# Patient Record
Sex: Female | Born: 1995 | Race: Black or African American | Hispanic: No | Smoking: Never smoker
Health system: Southern US, Community
[De-identification: ages and names within clinical notes are randomized; demographics above are authoritative.]

## PROBLEM LIST (undated history)

## (undated) DIAGNOSIS — G43909 Migraine, unspecified, not intractable, without status migrainosus: Secondary | ICD-10-CM

## (undated) DIAGNOSIS — Z789 Other specified health status: Secondary | ICD-10-CM

## (undated) HISTORY — PX: WISDOM TOOTH EXTRACTION: SHX21

## (undated) HISTORY — DX: Migraine, unspecified, not intractable, without status migrainosus: G43.909

---
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
## 2014-11-23 ENCOUNTER — Encounter (HOSPITAL_COMMUNITY): Payer: Self-pay | Admitting: *Deleted

## 2014-11-23 ENCOUNTER — Emergency Department (HOSPITAL_COMMUNITY)
Admission: EM | Admit: 2014-11-23 | Discharge: 2014-11-23 | Disposition: A | Discharge status: Home / Self Care | Payer: PRIVATE HEALTH INSURANCE | Attending: Emergency Medicine | Admitting: Emergency Medicine

## 2014-11-23 DIAGNOSIS — N3000 Acute cystitis without hematuria: Secondary | ICD-10-CM | POA: Diagnosis not present

## 2014-11-23 DIAGNOSIS — R109 Unspecified abdominal pain: Secondary | ICD-10-CM | POA: Diagnosis present

## 2014-11-23 DIAGNOSIS — Z3202 Encounter for pregnancy test, result negative: Secondary | ICD-10-CM | POA: Insufficient documentation

## 2014-11-23 DIAGNOSIS — Z79899 Other long term (current) drug therapy: Secondary | ICD-10-CM | POA: Diagnosis not present

## 2014-11-23 LAB — URINALYSIS, ROUTINE W REFLEX MICROSCOPIC
Bilirubin Urine: NEGATIVE
Glucose, UA: NEGATIVE mg/dL
KETONES UR: NEGATIVE mg/dL
Nitrite: NEGATIVE
Protein, ur: NEGATIVE mg/dL
SPECIFIC GRAVITY, URINE: 1.016 (ref 1.005–1.030)
Urobilinogen, UA: 0.2 mg/dL (ref 0.0–1.0)
pH: 7 (ref 5.0–8.0)

## 2014-11-23 LAB — URINE MICROSCOPIC-ADD ON

## 2014-11-23 LAB — PREGNANCY, URINE: PREG TEST UR: NEGATIVE

## 2014-11-23 MED ORDER — CEPHALEXIN 500 MG PO CAPS: 500.0000 mg | ORAL_CAPSULE | Freq: Three times a day (TID) | ORAL | Status: DC

## 2014-11-23 MED ORDER — ONDANSETRON 8 MG PO TBDP: 8.0000 mg | ORAL_TABLET | Freq: Three times a day (TID) | ORAL | Status: DC | PRN

## 2014-11-23 MED ORDER — CEPHALEXIN 250 MG PO CAPS
500.0000 mg | ORAL_CAPSULE | Freq: Once | ORAL | Status: AC
  Administered 2014-11-23: 500 mg via ORAL
  Filled 2014-11-23: qty 2

## 2014-11-23 NOTE — ED Provider Notes (Signed)
CSN: [id number]     Arrival date & time 11/23/14  [id number] History   First MD Initiated Contact with Patient 11/23/14 [phone]     Chief Complaint  Patient presents with  . Flank Pain      HPI Patient presents to the emergency department complaining of suprapubic discomfort and bilateral flank pain with mild nausea.  She denies vomiting.  She denies diarrhea.  No fevers or chills.  She's had pyelonephritis before and is concerned that this could be the presenting symptoms again.  She has urinary frequency.  Symptoms are mild to moderate in severity.  Nothing worsens or improves her symptoms   History reviewed. No pertinent past medical history. History reviewed. No pertinent past surgical history. No family history on file. History  Substance Use Topics  . Smoking status: Never Smoker   . Smokeless tobacco: Not on file  . Alcohol Use: No   OB History    No data available     Review of Systems  All other systems reviewed and are negative.     Allergies  Review of patient's allergies indicates no known allergies.  Home Medications   Prior to Admission medications   Medication Sig Start Date End Date Taking? Authorizing Provider  ferrous sulfate 325 (65 FE) MG tablet Take 325 mg by mouth daily with breakfast.   Yes Historical Provider, MD  Levonorgestrel (SKYLA IU) 1 Device by Intrauterine route continuous.   Yes Historical Provider, MD  cephALEXin (KEFLEX) 500 MG capsule Take 1 capsule (500 mg total) by mouth 3 (three) times daily. 11/23/14   Lyanne CoKevin M Challis Crill, MD   BP 107/63 mmHg  Pulse 74  Temp(Src) 97.8 F (36.6 C) (Oral)  Resp 17  Ht 5\' 8"  (1.727 m)  Wt 167 lb (75.751 kg)  BMI 25.40 kg/m2  SpO2 100%  LMP 11/16/2014 Physical Exam  Constitutional: She is oriented to person, place, and time. She appears well-developed and well-nourished. No distress.  HENT:  Head: Normocephalic and atraumatic.  Eyes: EOM are normal.  Neck: Normal range of motion.  Cardiovascular: Normal  rate, regular rhythm and normal heart sounds.   Pulmonary/Chest: Effort normal and breath sounds normal.  Abdominal: Soft. She exhibits no distension. There is no tenderness.  Genitourinary:  No CVA tenderness  Musculoskeletal: Normal range of motion.  Neurological: She is alert and oriented to person, place, and time.  Skin: Skin is warm and dry.  Psychiatric: She has a normal mood and affect. Judgment normal.  Nursing note and vitals reviewed.   ED Course  Procedures (including critical care time) Labs Review Labs Reviewed  URINALYSIS, ROUTINE W REFLEX MICROSCOPIC - Abnormal; Notable for the following:    APPearance CLOUDY (*)    Hgb urine dipstick MODERATE (*)    Leukocytes, UA LARGE (*)    All other components within normal limits  URINE MICROSCOPIC-ADD ON - Abnormal; Notable for the following:    Bacteria, UA FEW (*)    All other components within normal limits  PREGNANCY, URINE    Imaging Review No results found.   EKG Interpretation None      MDM   Final diagnoses:  Acute cystitis without hematuria    Patient be treated for urinary tract infection.  Home with anti-nausea medicine and antibiotics.    Lyanne CoKevin M Sladen Plancarte, MD 11/23/14 [phone]

## 2014-11-23 NOTE — ED Notes (Signed)
The pt is c/o bi-lateral flank pain since yesterday with nausea.. She reports that her kidneys are hurting  lmp last week

## 2014-12-22 NOTE — L&D Delivery Note (Signed)
  Vaginal Delivery Note The pt utilized an epidural as pain management.   Spontaneous  rupture of membranes today, at 1530 on 10/15/15, clear.  GBS was negative. Cervical dilation was complete at  1005.  NICHD Category 1.    Pushing with guidance began at 1145.   After  1 hour and  14 minutes of pushing the head, shoulders and the body of a viable female infant "Whitney Martin" delivered spontaneously with maternal effort in the LOA position at 1254   With vigorous tone and spontaneous cry, the infant was placed on moms abd.  After the umbilical cord was clamped it was cut by the FOB, then cord blood was obtained for evaluation.  Spontaneous delivery of a intact placenta with a 3 vessel cord via Shultz/Duncan at 1302 .   Episiotomy: None   The vulva, perineum, vaginal vault, rectum and cervix were inspected revealing a hemostatic1rst degree vaginal, no repairs needed.  Postpartum pitocin as ordered.  Fundus firm, lochia minimum, bleeding under control. EBL 90, Pt hemodynamically stable.   Sponge, laps and needle count correct and verified with the primary care nurse.  Attending MD Present at delivery.   Routine postpartum orders   Mother unsure about method of contraception  Mom plans to breastfeed  Infant to have outpatient circumcision   Placenta to pathology: NO     Cord gases sent to lab: NO  Cord blood sent to lab: YES   APGARS:  9 at 1 minute and 9 at 5 minutes Weight:. TBD   Both mom and baby were left in stable condition, baby skin to skin.    Alphonzo Severanceachel Wateen Varon, CNM, MSN 10/15/2015. 1:50 PM

## 2015-03-03 ENCOUNTER — Emergency Department (HOSPITAL_COMMUNITY): Payer: Medicaid Other

## 2015-03-03 ENCOUNTER — Emergency Department (HOSPITAL_COMMUNITY)
Admission: EM | Admit: 2015-03-03 | Discharge: 2015-03-04 | Disposition: A | Discharge status: Home / Self Care | Payer: Medicaid Other | Attending: Emergency Medicine | Admitting: Emergency Medicine

## 2015-03-03 ENCOUNTER — Encounter (HOSPITAL_COMMUNITY): Payer: Self-pay | Admitting: Emergency Medicine

## 2015-03-03 DIAGNOSIS — R109 Unspecified abdominal pain: Secondary | ICD-10-CM

## 2015-03-03 DIAGNOSIS — Z792 Long term (current) use of antibiotics: Secondary | ICD-10-CM | POA: Insufficient documentation

## 2015-03-03 DIAGNOSIS — Z3A00 Weeks of gestation of pregnancy not specified: Secondary | ICD-10-CM | POA: Diagnosis not present

## 2015-03-03 DIAGNOSIS — R1030 Lower abdominal pain, unspecified: Secondary | ICD-10-CM | POA: Insufficient documentation

## 2015-03-03 DIAGNOSIS — O2341 Unspecified infection of urinary tract in pregnancy, first trimester: Secondary | ICD-10-CM | POA: Diagnosis not present

## 2015-03-03 DIAGNOSIS — O9989 Other specified diseases and conditions complicating pregnancy, childbirth and the puerperium: Secondary | ICD-10-CM | POA: Diagnosis not present

## 2015-03-03 DIAGNOSIS — O26899 Other specified pregnancy related conditions, unspecified trimester: Secondary | ICD-10-CM

## 2015-03-03 DIAGNOSIS — Z79899 Other long term (current) drug therapy: Secondary | ICD-10-CM | POA: Diagnosis not present

## 2015-03-03 DIAGNOSIS — N390 Urinary tract infection, site not specified: Secondary | ICD-10-CM

## 2015-03-03 LAB — COMPREHENSIVE METABOLIC PANEL
ALT: 9 U/L (ref 0–35)
ANION GAP: 6 (ref 5–15)
AST: 16 U/L (ref 0–37)
Albumin: 3.8 g/dL (ref 3.5–5.2)
Alkaline Phosphatase: 51 U/L (ref 39–117)
BUN: 9 mg/dL (ref 6–23)
CHLORIDE: 105 mmol/L (ref 96–112)
CO2: 26 mmol/L (ref 19–32)
CREATININE: 0.7 mg/dL (ref 0.50–1.10)
Calcium: 9.3 mg/dL (ref 8.4–10.5)
GFR calc non Af Amer: >90 mL/min (ref >90)
Glucose, Bld: 94 mg/dL (ref 70–99)
POTASSIUM: 4.1 mmol/L (ref 3.5–5.1)
SODIUM: 137 mmol/L (ref 135–145)
Total Bilirubin: 0.2 mg/dL — ABNORMAL LOW (ref 0.3–1.2)
Total Protein: 6.8 g/dL (ref 6.0–8.3)

## 2015-03-03 LAB — CBC WITH DIFFERENTIAL/PLATELET
BASOS ABS: 0 10*3/uL (ref 0.0–0.1)
Basophils Relative: 0 % (ref 0–1)
EOS ABS: 0.2 10*3/uL (ref 0.0–0.7)
EOS PCT: 2 % (ref 0–5)
HCT: 40 % (ref 36.0–46.0)
HEMOGLOBIN: 13.4 g/dL (ref 12.0–15.0)
LYMPHS PCT: 21 % (ref 12–46)
Lymphs Abs: 2.3 10*3/uL (ref 0.7–4.0)
MCH: 29.1 pg (ref 26.0–34.0)
MCHC: 33.5 g/dL (ref 30.0–36.0)
MCV: 87 fL (ref 78.0–100.0)
Monocytes Absolute: 0.9 10*3/uL (ref 0.1–1.0)
Monocytes Relative: 8 % (ref 3–12)
NEUTROS ABS: 7.7 10*3/uL (ref 1.7–7.7)
NEUTROS PCT: 69 % (ref 43–77)
Platelets: 300 10*3/uL (ref 150–400)
RBC: 4.6 MIL/uL (ref 3.87–5.11)
RDW: 13.1 % (ref 11.5–15.5)
WBC: 11.1 10*3/uL — AB (ref 4.0–10.5)

## 2015-03-03 LAB — URINALYSIS, ROUTINE W REFLEX MICROSCOPIC
Bilirubin Urine: NEGATIVE
Glucose, UA: NEGATIVE mg/dL
KETONES UR: NEGATIVE mg/dL
NITRITE: NEGATIVE
PROTEIN: NEGATIVE mg/dL
SPECIFIC GRAVITY, URINE: 1.01 (ref 1.005–1.030)
Urobilinogen, UA: 0.2 mg/dL (ref 0.0–1.0)
pH: 5.5 (ref 5.0–8.0)

## 2015-03-03 LAB — URINE MICROSCOPIC-ADD ON

## 2015-03-03 LAB — HCG, QUANTITATIVE, PREGNANCY: hCG, Beta Chain, Quant, S: 39067 m[IU]/mL — ABNORMAL HIGH (ref <5)

## 2015-03-03 LAB — POC URINE PREG, ED: Preg Test, Ur: POSITIVE — AB

## 2015-03-03 MED ORDER — ACETAMINOPHEN 500 MG PO TABS
1000.0000 mg | ORAL_TABLET | Freq: Once | ORAL | Status: AC
  Administered 2015-03-03: 1000 mg via ORAL
  Filled 2015-03-03: qty 2

## 2015-03-03 NOTE — ED Notes (Signed)
Pt transported to US

## 2015-03-03 NOTE — ED Notes (Signed)
Lab called to add on HcG

## 2015-03-03 NOTE — ED Notes (Signed)
Pt c/o intermittent abdominal pain x 2 weeks. Pt is currently pregnant but unsure how far along. Pt denies vaginal discharge.

## 2015-03-03 NOTE — ED Provider Notes (Signed)
CSN: [id number]     Arrival date & time 03/03/15  1526 History   First MD Initiated Contact with Patient 03/03/15 2000     Chief Complaint  Patient presents with  . Abdominal Pain     (Consider location/radiation/quality/duration/timing/severity/associated sxs/prior Treatment) HPI Comments: Patient presents to the emergency department with chief complaint of intermittent abdominal pain 2 weeks. She states that she knows that she is pregnant, but is uncertain how far along she has. She denies any vaginal discharge or bleeding. She does report having dysuria. She denies any fevers, chills, nausea, or vomiting. There are no aggravating or alleviating factors.  The history is provided by the patient. No language interpreter was used.    History reviewed. No pertinent past medical history. History reviewed. No pertinent past surgical history. No family history on file. History  Substance Use Topics  . Smoking status: Never Smoker   . Smokeless tobacco: Not on file  . Alcohol Use: No   OB History    Gravida Para Term Preterm AB TAB SAB Ectopic Multiple Living   1              Review of Systems  Constitutional: Negative for fever and chills.  Respiratory: Negative for shortness of breath.   Cardiovascular: Negative for chest pain.  Gastrointestinal: Positive for abdominal pain. Negative for nausea, vomiting, diarrhea and constipation.  Genitourinary: Negative for dysuria, vaginal bleeding, vaginal discharge and vaginal pain.  All other systems reviewed and are negative.     Allergies  Review of patient's allergies indicates no known allergies.  Home Medications   Prior to Admission medications   Medication Sig Start Date End Date Taking? Authorizing Provider  cephALEXin (KEFLEX) 500 MG capsule Take 1 capsule (500 mg total) by mouth 3 (three) times daily. 11/23/14   Azalia Bilis, MD  ferrous sulfate 325 (65 FE) MG tablet Take 325 mg by mouth daily with breakfast.    Historical  Provider, MD  Levonorgestrel (SKYLA IU) 1 Device by Intrauterine route continuous.    Historical Provider, MD  ondansetron (ZOFRAN ODT) 8 MG disintegrating tablet Take 1 tablet (8 mg total) by mouth every 8 (eight) hours as needed for nausea or vomiting. 11/23/14   Azalia Bilis, MD   BP 111/62 mmHg  Pulse 71  Temp(Src) 98.5 F (36.9 C) (Oral)  Resp 12  Ht  (1.702 m)  Wt 154 lb (69.854 kg)  BMI 24.11 kg/m2  SpO2 100%  LMP 01/04/2015 (Approximate) Physical Exam  Constitutional: She is oriented to person, place, and time. She appears well-developed and well-nourished.  HENT:  Head: Normocephalic and atraumatic.  Eyes: Conjunctivae and EOM are normal. Pupils are equal, round, and reactive to light.  Neck: Normal range of motion. Neck supple.  Cardiovascular: Normal rate and regular rhythm.  Exam reveals no gallop and no friction rub.   No murmur heard. Pulmonary/Chest: Effort normal and breath sounds normal. No respiratory distress. She has no wheezes. She has no rales. She exhibits no tenderness.  Abdominal: Soft. Bowel sounds are normal. She exhibits no distension and no mass. There is no tenderness. There is no rebound and no guarding.  Mild suprapubic abdominal tenderness, otherwise no focal abdominal tenderness  Genitourinary:  Pelvic exam chaperoned by female ER tech, no right or left adnexal tenderness, no uterine tenderness, no vaginal discharge, no bleeding, no CMT or friability, no foreign body, no injury to the external genitalia, no other significant findings, Os is closed   Musculoskeletal: Normal range  of motion. She exhibits no edema or tenderness.  Neurological: She is alert and oriented to person, place, and time.  Skin: Skin is warm and dry.  Psychiatric: She has a normal mood and affect. Her behavior is normal. Judgment and thought content normal.  Nursing note and vitals reviewed.   ED Course  Procedures (including critical care time) Labs Review Labs  Reviewed  CBC WITH DIFFERENTIAL/PLATELET - Abnormal; Notable for the following:    WBC 11.1 (*)    All other components within normal limits  COMPREHENSIVE METABOLIC PANEL - Abnormal; Notable for the following:    Total Bilirubin 0.2 (*)    All other components within normal limits  URINALYSIS, ROUTINE W REFLEX MICROSCOPIC - Abnormal; Notable for the following:    APPearance CLOUDY (*)    Hgb urine dipstick LARGE (*)    Leukocytes, UA LARGE (*)    All other components within normal limits  URINE MICROSCOPIC-ADD ON - Abnormal; Notable for the following:    Squamous Epithelial / LPF FEW (*)    Bacteria, UA FEW (*)    All other components within normal limits  POC URINE PREG, ED - Abnormal; Notable for the following:    Preg Test, Ur POSITIVE (*)    All other components within normal limits  WET PREP, GENITAL  HCG, QUANTITATIVE, PREGNANCY  GC/CHLAMYDIA PROBE AMP ([place])    Imaging Review [place] Ob Comp Less 14 Wks  03/03/2015   CLINICAL DATA:  Subacute onset of mid to lower abdominal pain. Initial encounter.  EXAM: OBSTETRIC <14 WK US AND TRANSVAGINAL OB US  TECHNIQUE: Both transabdominal and transvaginal ultrasound examinations were performed for complete evaluation of the gestation as well as the maternal uterus, adnexal regions, and pelvic cul-de-sac. Transvaginal technique was performed to assess early pregnancy.  COMPARISON:  CT of the abdomen and pelvis performed 06/20/2004  FINDINGS: Intrauterine gestational sac: Visualized/normal in shape.  Yolk sac:  Yes  Embryo:  Yes  Cardiac Activity: Yes  Heart Rate: 131  bpm  CRL:  6.0 mm   6 w   3 d                  US EDC: 10/24/2015  Maternal uterus/adnexae: No subchorionic hemorrhage is noted. The uterus is retroflexed and unremarkable in appearance.  The ovaries are grossly unremarkable in appearance. The right ovary measures 3.2 x 2.4 x 2.8 cm. A right-sided corpus luteum is noted. The left ovary is within normal limits, measuring 3.3 x  1.1 x 1.6 cm. No suspicious adnexal masses are seen; there is no evidence for ovarian torsion.  A trace amount of free fluid is noted within the pelvic cul-de-sac.  IMPRESSION: Single live intrauterine pregnancy noted, with a crown-rump length of 6 mm, corresponding to a gestational age of [redacted] weeks 3 days. This does not match the gestational age by LMP, and reflects a new estimated date of delivery of October 24, 2015.   Electronically Signed   By: Roanna RaiderJeffery  Chang M.D.   On: 03/03/2015 20:50   [place] Ob Transvaginal  03/03/2015   CLINICAL DATA:  Subacute onset of mid to lower abdominal pain. Initial encounter.  EXAM: OBSTETRIC <14 WK US AND TRANSVAGINAL OB US  TECHNIQUE: Both transabdominal and transvaginal ultrasound examinations were performed for complete evaluation of the gestation as well as the maternal uterus, adnexal regions, and pelvic cul-de-sac. Transvaginal technique was performed to assess early pregnancy.  COMPARISON:  CT of the abdomen and pelvis performed 06/20/2004  FINDINGS: Intrauterine gestational sac: Visualized/normal in shape.  Yolk sac:  Yes  Embryo:  Yes  Cardiac Activity: Yes  Heart Rate: 131  bpm  CRL:  6.0 mm   6 w   3 d                  [place] EDC: 10/24/2015  Maternal uterus/adnexae: No subchorionic hemorrhage is noted. The uterus is retroflexed and unremarkable in appearance.  The ovaries are grossly unremarkable in appearance. The right ovary measures 3.2 x 2.4 x 2.8 cm. A right-sided corpus luteum is noted. The left ovary is within normal limits, measuring 3.3 x 1.1 x 1.6 cm. No suspicious adnexal masses are seen; there is no evidence for ovarian torsion.  A trace amount of free fluid is noted within the pelvic cul-de-sac.  IMPRESSION: Single live intrauterine pregnancy noted, with a crown-rump length of 6 mm, corresponding to a gestational age of [redacted] weeks 3 days. This does not match the gestational age by LMP, and reflects a new estimated date of delivery of October 24, 2015.    Electronically Signed   By: Roanna Raider M.D.   On: 03/03/2015 20:50     EKG Interpretation None      MDM   Final diagnoses:  Abdominal pain in pregnancy  UTI (lower urinary tract infection)    Patient with intermittent abdominal pain and dysuria 2 weeks. She also has positive pregnancy test. Ultrasound is remarkable for a single intrauterine pregnancy. HCG is pending. Urinalysis remarkable for probable UTI. Will treat with Keflex. Anticipate follow-up with OB/GYN.  HCG is consistent with age pregnancy. Wet prep is pending.  Wet prep negative.  Will treat for UTI.  Reassuring [place] and HCG.  No vaginal bleeding.  DC to home with OBGYN follow-up.  Roxy Horseman, PA-C 03/04/15 0018  Blake Divine, MD 03/04/15 [phone]

## 2015-03-04 LAB — WET PREP, GENITAL
Clue Cells Wet Prep HPF POC: NONE SEEN
TRICH WET PREP: NONE SEEN
Yeast Wet Prep HPF POC: NONE SEEN

## 2015-03-04 MED ORDER — CEPHALEXIN 500 MG PO CAPS: 500.0000 mg | ORAL_CAPSULE | Freq: Four times a day (QID) | ORAL | Status: DC

## 2015-03-04 NOTE — ED Notes (Signed)
Pt. Refused wheelchair and left with all belongings 

## 2015-03-04 NOTE — Discharge Instructions (Signed)
Abdominal Pain During Pregnancy °Abdominal pain is common in pregnancy. Most of the time, it does not cause harm. There are many causes of abdominal pain. Some causes are more serious than others. Some of the causes of abdominal pain in pregnancy are easily diagnosed. Occasionally, the diagnosis takes time to understand. Other times, the cause is not determined. Abdominal pain can be a sign that something is very wrong with the pregnancy, or the pain may have nothing to do with the pregnancy at all. For this reason, always tell your health care provider if you have any abdominal discomfort. °HOME CARE INSTRUCTIONS  °Monitor your abdominal pain for any changes. The following actions may help to alleviate any discomfort you are experiencing: °· Do not have sexual intercourse or put anything in your vagina until your symptoms go away completely. °· Get plenty of rest until your pain improves. °· Drink clear fluids if you feel nauseous. Avoid solid food as long as you are uncomfortable or nauseous. °· Only take over-the-counter or prescription medicine as directed by your health care provider. °· Keep all follow-up appointments with your health care provider. °SEEK IMMEDIATE MEDICAL CARE IF: °· You are bleeding, leaking fluid, or passing tissue from the vagina. °· You have increasing pain or cramping. °· You have persistent vomiting. °· You have painful or bloody urination. °· You have a fever. °· You notice a decrease in your baby's movements. °· You have extreme weakness or feel faint. °· You have shortness of breath, with or without abdominal pain. °· You develop a severe headache with abdominal pain. °· You have abnormal vaginal discharge with abdominal pain. °· You have persistent diarrhea. °· You have abdominal pain that continues even after rest, or gets worse. °MAKE SURE YOU:  °· Understand these instructions. °· Will watch your condition. °· Will get help right away if you are not doing well or get  worse. °Document Released: 12/08/2005 Document Revised: 09/28/2013 Document Reviewed: 07/07/2013 °ExitCare® Patient Information ©2015 [employer]. This information is not intended to replace advice given to you by your health care provider. Make sure you discuss any questions you have with your health care provider. ° °Urinary Tract Infection °Urinary tract infections (UTIs) can develop anywhere along your urinary tract. Your urinary tract is your body's drainage system for removing wastes and extra water. Your urinary tract includes two kidneys, two ureters, a bladder, and a urethra. Your kidneys are a pair of bean-shaped organs. Each kidney is about the size of your fist. They are located below your ribs, one on each side of your spine. °CAUSES °Infections are caused by microbes, which are microscopic organisms, including fungi, viruses, and bacteria. These organisms are so small that they can only be seen through a microscope. Bacteria are the microbes that most commonly cause UTIs. °SYMPTOMS  °Symptoms of UTIs may vary by age and gender of the patient and by the location of the infection. Symptoms in young women typically include a frequent and intense urge to urinate and a painful, burning feeling in the bladder or urethra during urination. Older women and men are more likely to be tired, shaky, and weak and have muscle aches and abdominal pain. A fever may mean the infection is in your kidneys. Other symptoms of a kidney infection include pain in your back or sides below the ribs, nausea, and vomiting. °DIAGNOSIS °To diagnose a UTI, your caregiver will ask you about your symptoms. Your caregiver also will ask to provide a urine sample.   The urine sample will be tested for bacteria and white blood cells. White blood cells are made by your body to help fight infection. °TREATMENT  °Typically, UTIs can be treated with medication. Because most UTIs are caused by a bacterial infection, they usually can be treated  with the use of antibiotics. The choice of antibiotic and length of treatment depend on your symptoms and the type of bacteria causing your infection. °HOME CARE INSTRUCTIONS °· If you were prescribed antibiotics, take them exactly as your caregiver instructs you. Finish the medication even if you feel better after you have only taken some of the medication. °· Drink enough water and fluids to keep your urine clear or pale yellow. °· Avoid caffeine, tea, and carbonated beverages. They tend to irritate your bladder. °· Empty your bladder often. Avoid holding urine for long periods of time. °· Empty your bladder before and after sexual intercourse. °· After a bowel movement, women should cleanse from front to back. Use each tissue only once. °SEEK MEDICAL CARE IF:  °· You have back pain. °· You develop a fever. °· Your symptoms do not begin to resolve within 3 days. °SEEK IMMEDIATE MEDICAL CARE IF:  °· You have severe back pain or lower abdominal pain. °· You develop chills. °· You have nausea or vomiting. °· You have continued burning or discomfort with urination. °MAKE SURE YOU:  °· Understand these instructions. °· Will watch your condition. °· Will get help right away if you are not doing well or get worse. °Document Released: 09/17/2005 Document Revised: 06/08/2012 Document Reviewed: 01/16/2012 °ExitCare® Patient Information ©2015 [employer]. This information is not intended to replace advice given to you by your health care provider. Make sure you discuss any questions you have with your health care provider. ° °

## 2015-03-05 LAB — GC/CHLAMYDIA PROBE AMP (~~LOC~~) NOT AT ARMC
CHLAMYDIA, DNA PROBE: NEGATIVE
NEISSERIA GONORRHEA: NEGATIVE

## 2015-03-08 ENCOUNTER — Encounter (HOSPITAL_COMMUNITY): Payer: Self-pay | Admitting: *Deleted

## 2015-03-08 ENCOUNTER — Emergency Department (HOSPITAL_COMMUNITY)
Admission: EM | Admit: 2015-03-08 | Discharge: 2015-03-08 | Disposition: A | Discharge status: Home / Self Care | Payer: Self-pay | Source: Home / Self Care | Attending: Family Medicine | Admitting: Family Medicine

## 2015-03-08 DIAGNOSIS — J00 Acute nasopharyngitis [common cold]: Secondary | ICD-10-CM

## 2015-03-08 MED ORDER — IPRATROPIUM BROMIDE 0.06 % NA SOLN: 2.0000 | Freq: Four times a day (QID) | NASAL | Status: DC

## 2015-03-08 NOTE — Discharge Instructions (Signed)
Upper Respiratory Infection, Adult An upper respiratory infection (URI) is also sometimes known as the common cold. The upper respiratory tract includes the nose, sinuses, throat, trachea, and bronchi. Bronchi are the airways leading to the lungs. Most people improve within 1 week, but symptoms can last up to 2 weeks. A residual cough may last even longer.  CAUSES Many different viruses can infect the tissues lining the upper respiratory tract. The tissues become irritated and inflamed and often become very moist. Mucus production is also common. A cold is contagious. You can easily spread the virus to others by oral contact. This includes kissing, sharing a glass, coughing, or sneezing. Touching your mouth or nose and then touching a surface, which is then touched by another person, can also spread the virus. SYMPTOMS  Symptoms typically develop 1 to 3 days after you come in contact with a cold virus. Symptoms vary from person to person. They may include:  Runny nose.  Sneezing.  Nasal congestion.  Sinus irritation.  Sore throat.  Loss of voice (laryngitis).  Cough.  Fatigue.  Muscle aches.  Loss of appetite.  Headache.  Low-grade fever. DIAGNOSIS  You might diagnose your own cold based on familiar symptoms, since most people get a cold 2 to 3 times a year. Your caregiver can confirm this based on your exam. Most importantly, your caregiver can check that your symptoms are not due to another disease such as strep throat, sinusitis, pneumonia, asthma, or epiglottitis. Blood tests, throat tests, and X-rays are not necessary to diagnose a common cold, but they may sometimes be helpful in excluding other more serious diseases. Your caregiver will decide if any further tests are required. RISKS AND COMPLICATIONS  You may be at risk for a more severe case of the common cold if you smoke cigarettes, have chronic heart disease (such as heart failure) or lung disease (such as asthma), or if  you have a weakened immune system. The very young and very old are also at risk for more serious infections. Bacterial sinusitis, middle ear infections, and bacterial pneumonia can complicate the common cold. The common cold can worsen asthma and chronic obstructive pulmonary disease (COPD). Sometimes, these complications can require emergency medical care and may be life-threatening. PREVENTION  The best way to protect against getting a cold is to practice good hygiene. Avoid oral or hand contact with people with cold symptoms. Wash your hands often if contact occurs. There is no clear evidence that vitamin C, vitamin E, echinacea, or exercise reduces the chance of developing a cold. However, it is always recommended to get plenty of rest and practice good nutrition. TREATMENT  Treatment is directed at relieving symptoms. There is no cure. Antibiotics are not effective, because the infection is caused by a virus, not by bacteria. Treatment may include:  Increased fluid intake. Sports drinks offer valuable electrolytes, sugars, and fluids.  Breathing heated mist or steam (vaporizer or shower).  Eating chicken soup or other clear broths, and maintaining good nutrition.  Getting plenty of rest.  Using gargles or lozenges for comfort.  Controlling fevers with ibuprofen or acetaminophen as directed by your caregiver.  Increasing usage of your inhaler if you have asthma. Zinc gel and zinc lozenges, taken in the first 24 hours of the common cold, can shorten the duration and lessen the severity of symptoms. Pain medicines may help with fever, muscle aches, and throat pain. A variety of non-prescription medicines are available to treat congestion and runny nose. Your caregiver   can make recommendations and may suggest nasal or lung inhalers for other symptoms.  HOME CARE INSTRUCTIONS   Only take over-the-counter or prescription medicines for pain, discomfort, or fever as directed by your  caregiver.  Use a warm mist humidifier or inhale steam from a shower to increase air moisture. This may keep secretions moist and make it easier to breathe.  Drink enough water and fluids to keep your urine clear or pale yellow.  Rest as needed.  Return to work when your temperature has returned to normal or as your caregiver advises. You may need to stay home longer to avoid infecting others. You can also use a face mask and careful hand washing to prevent spread of the virus. SEEK MEDICAL CARE IF:   After the first few days, you feel you are getting worse rather than better.  You need your caregiver's advice about medicines to control symptoms.  You develop chills, worsening shortness of breath, or brown or red sputum. These may be signs of pneumonia.  You develop yellow or brown nasal discharge or pain in the face, especially when you bend forward. These may be signs of sinusitis.  You develop a fever, swollen neck glands, pain with swallowing, or white areas in the back of your throat. These may be signs of strep throat. SEEK IMMEDIATE MEDICAL CARE IF:   You have a fever.  You develop severe or persistent headache, ear pain, sinus pain, or chest pain.  You develop wheezing, a prolonged cough, cough up blood, or have a change in your usual mucus (if you have chronic lung disease).  You develop sore muscles or a stiff neck. Document Released: 06/03/2001 Document Revised: 03/01/2012 Document Reviewed: 03/15/2014 ExitCare Patient Information 2015 [employer]. This information is not intended to replace advice given to you by your health care provider. Make sure you discuss any questions you have with your health care provider.  

## 2015-03-08 NOTE — ED Notes (Signed)
Pt  Reports   Body  Aches   Low  abd  Pain     With  Some  Nausea    -    Pt  Seen  5  6  Days  Ago  For  uti  And  Is  Taking  Keflex    -  Pt  Is  [redacted]  Weeks  Pregnant        Pt  denys  Any     Vaginal bleeding  Or   And  Ob  Symptoms

## 2015-03-08 NOTE — ED Provider Notes (Signed)
CSN: [id number]     Arrival date & time 03/08/15  0815 History   First MD Initiated Contact with Patient 03/08/15 [phone]     Chief Complaint  Patient presents with  . Generalized Body Aches   (Consider location/radiation/quality/duration/timing/severity/associated sxs/prior Treatment) HPI Comments: Currently pregnant Nonsmoker PCP: none Works at [employer]  Patient is a 19 y.o. female presenting with URI. The history is provided by the patient.  URI Presenting symptoms: congestion, cough and rhinorrhea   Presenting symptoms: no ear pain, no facial pain, no fatigue, no fever and no sore throat   Severity:  Mild Onset quality:  Gradual Duration:  2 days Timing:  Constant Associated symptoms: myalgias     History reviewed. No pertinent past medical history. History reviewed. No pertinent past surgical history. History reviewed. No pertinent family history. History  Substance Use Topics  . Smoking status: Never Smoker   . Smokeless tobacco: Not on file  . Alcohol Use: No   OB History    Gravida Para Term Preterm AB TAB SAB Ectopic Multiple Living   1              Review of Systems  Constitutional: Negative for fever and fatigue.  HENT: Positive for congestion and rhinorrhea. Negative for ear pain and sore throat.   Respiratory: Positive for cough.   Musculoskeletal: Positive for myalgias.  All other systems reviewed and are negative.   Allergies  Review of patient's allergies indicates no known allergies.  Home Medications   Prior to Admission medications   Medication Sig Start Date End Date Taking? Authorizing Provider  cephALEXin (KEFLEX) 500 MG capsule Take 1 capsule (500 mg total) by mouth 4 (four) times daily. 03/04/15   Roxy Horsemanobert Browning, PA-C  ferrous sulfate 325 (65 FE) MG tablet Take 325 mg by mouth daily with breakfast.    Historical Provider, MD  ipratropium (ATROVENT) 0.06 % nasal spray Place 2 sprays into both nostrils 4 (four) times daily. As needed for  nasal congestion 03/08/15   Ria ClockJennifer Lee H Demaris Bousquet, PA  Levonorgestrel (SKYLA IU) 1 Device by Intrauterine route continuous.    Historical Provider, MD  ondansetron (ZOFRAN ODT) 8 MG disintegrating tablet Take 1 tablet (8 mg total) by mouth every 8 (eight) hours as needed for nausea or vomiting. 11/23/14   Azalia BilisKevin Campos, MD   BP 110/75 mmHg  Pulse 89  Temp(Src) 98.2 F (36.8 C) (Oral)  Resp 16  SpO2 99%  LMP 01/04/2015 (Approximate) Physical Exam  Constitutional: She is oriented to person, place, and time. She appears well-developed and well-nourished. No distress.  HENT:  Head: Normocephalic and atraumatic.  Right Ear: Hearing, tympanic membrane, external ear and ear canal normal.  Left Ear: Hearing, tympanic membrane, external ear and ear canal normal.  Nose: Nose normal.  Mouth/Throat: Uvula is midline, oropharynx is clear and moist and mucous membranes are normal.  Eyes: Conjunctivae are normal.  Neck: Normal range of motion. Neck supple.  Cardiovascular: Normal rate, regular rhythm and normal heart sounds.   Pulmonary/Chest: Effort normal and breath sounds normal. No respiratory distress. She has no wheezes.  Abdominal: Soft. Bowel sounds are normal. There is no tenderness.  Musculoskeletal: Normal range of motion.  Lymphadenopathy:    She has no cervical adenopathy.  Neurological: She is alert and oriented to person, place, and time.  Skin: Skin is warm and dry. No rash noted. No erythema.  Psychiatric: She has a normal mood and affect. Her behavior is normal.  Nursing note  and vitals reviewed.   ED Course  Procedures (including critical care time) Labs Review Labs Reviewed - No data to display  Imaging Review No results found.   MDM   1. Common cold   Exam benign. Very mild URI Vitals normal Symptomatic care at home   Ria Clock, [place] 03/08/15 [phone]

## 2015-05-22 LAB — OB RESULTS CONSOLE HEPATITIS B SURFACE ANTIGEN: Hepatitis B Surface Ag: NEGATIVE

## 2015-05-22 LAB — OB RESULTS CONSOLE GC/CHLAMYDIA
CHLAMYDIA, DNA PROBE: NEGATIVE
Gonorrhea: NEGATIVE

## 2015-05-22 LAB — OB RESULTS CONSOLE RPR: RPR: NONREACTIVE

## 2015-05-22 LAB — OB RESULTS CONSOLE ABO/RH: RH TYPE: POSITIVE

## 2015-05-22 LAB — OB RESULTS CONSOLE RUBELLA ANTIBODY, IGM: Rubella: IMMUNE

## 2015-05-22 LAB — OB RESULTS CONSOLE HIV ANTIBODY (ROUTINE TESTING): HIV: NONREACTIVE

## 2015-09-25 LAB — OB RESULTS CONSOLE GBS: GBS: NEGATIVE

## 2015-10-14 ENCOUNTER — Encounter (HOSPITAL_COMMUNITY): Payer: Self-pay | Admitting: *Deleted

## 2015-10-14 ENCOUNTER — Inpatient Hospital Stay (HOSPITAL_COMMUNITY): Payer: Medicaid Other | Admitting: Anesthesiology

## 2015-10-14 ENCOUNTER — Inpatient Hospital Stay (HOSPITAL_COMMUNITY)
Admission: AD | Admit: 2015-10-14 | Discharge: 2015-10-17 | DRG: 775 | Disposition: A | Discharge status: Home / Self Care | Payer: Medicaid Other | Source: Ambulatory Visit | Attending: Obstetrics and Gynecology | Admitting: Obstetrics and Gynecology

## 2015-10-14 DIAGNOSIS — O4292 Full-term premature rupture of membranes, unspecified as to length of time between rupture and onset of labor: Secondary | ICD-10-CM | POA: Diagnosis present

## 2015-10-14 DIAGNOSIS — O0930 Supervision of pregnancy with insufficient antenatal care, unspecified trimester: Secondary | ICD-10-CM

## 2015-10-14 DIAGNOSIS — O43129 Velamentous insertion of umbilical cord, unspecified trimester: Secondary | ICD-10-CM | POA: Diagnosis present

## 2015-10-14 DIAGNOSIS — Z3A38 38 weeks gestation of pregnancy: Secondary | ICD-10-CM | POA: Diagnosis not present

## 2015-10-14 DIAGNOSIS — O43123 Velamentous insertion of umbilical cord, third trimester: Secondary | ICD-10-CM | POA: Diagnosis present

## 2015-10-14 HISTORY — DX: Other specified health status: Z78.9

## 2015-10-14 LAB — CBC
HCT: 36.9 % (ref 36.0–46.0)
Hemoglobin: 12.2 g/dL (ref 12.0–15.0)
MCH: 29.2 pg (ref 26.0–34.0)
MCHC: 33.1 g/dL (ref 30.0–36.0)
MCV: 88.3 fL (ref 78.0–100.0)
PLATELETS: 229 10*3/uL (ref 150–400)
RBC: 4.18 MIL/uL (ref 3.87–5.11)
RDW: 13.7 % (ref 11.5–15.5)
WBC: 11.9 10*3/uL — AB (ref 4.0–10.5)

## 2015-10-14 LAB — TYPE AND SCREEN
ABO/RH(D): O POS
Antibody Screen: NEGATIVE

## 2015-10-14 MED ORDER — CITRIC ACID-SODIUM CITRATE 334-500 MG/5ML PO SOLN: 30.0000 mL | ORAL | Status: DC | PRN

## 2015-10-14 MED ORDER — PHENYLEPHRINE 40 MCG/ML (10ML) SYRINGE FOR IV PUSH (FOR BLOOD PRESSURE SUPPORT)
80.0000 ug | PREFILLED_SYRINGE | INTRAVENOUS | Status: DC | PRN
  Filled 2015-10-14: qty 2
  Filled 2015-10-14: qty 20

## 2015-10-14 MED ORDER — OXYCODONE-ACETAMINOPHEN 5-325 MG PO TABS: 2.0000 | ORAL_TABLET | ORAL | Status: DC | PRN

## 2015-10-14 MED ORDER — LACTATED RINGERS IV SOLN
500.0000 mL | INTRAVENOUS | Status: DC | PRN
  Administered 2015-10-14: 500 mL via INTRAVENOUS

## 2015-10-14 MED ORDER — ONDANSETRON HCL 4 MG/2ML IJ SOLN
4.0000 mg | Freq: Four times a day (QID) | INTRAMUSCULAR | Status: DC | PRN
  Administered 2015-10-15: 4 mg via INTRAVENOUS
  Filled 2015-10-14: qty 2

## 2015-10-14 MED ORDER — OXYTOCIN BOLUS FROM INFUSION: 500.0000 mL | INTRAVENOUS | Status: DC

## 2015-10-14 MED ORDER — DIPHENHYDRAMINE HCL 50 MG/ML IJ SOLN: 12.5000 mg | INTRAMUSCULAR | Status: DC | PRN

## 2015-10-14 MED ORDER — OXYCODONE-ACETAMINOPHEN 5-325 MG PO TABS
1.0000 | ORAL_TABLET | ORAL | Status: DC | PRN
  Administered 2015-10-15: 1 via ORAL
  Filled 2015-10-14: qty 1

## 2015-10-14 MED ORDER — FENTANYL 2.5 MCG/ML BUPIVACAINE 1/10 % EPIDURAL INFUSION (WH - ANES)
14.0000 mL/h | INTRAMUSCULAR | Status: DC | PRN
  Administered 2015-10-14: 12 mL/h via EPIDURAL
  Administered 2015-10-14 – 2015-10-15 (×2): 14 mL/h via EPIDURAL
  Filled 2015-10-14 (×2): qty 125

## 2015-10-14 MED ORDER — BUPIVACAINE HCL (PF) 0.25 % IJ SOLN
INTRAMUSCULAR | Status: DC | PRN
  Administered 2015-10-14 (×2): 4 mL via EPIDURAL

## 2015-10-14 MED ORDER — OXYTOCIN 40 UNITS IN LACTATED RINGERS INFUSION - SIMPLE MED
62.5000 mL/h | INTRAVENOUS | Status: DC
  Filled 2015-10-14: qty 1000

## 2015-10-14 MED ORDER — ACETAMINOPHEN 325 MG PO TABS: 650.0000 mg | ORAL_TABLET | ORAL | Status: DC | PRN

## 2015-10-14 MED ORDER — NALBUPHINE HCL 10 MG/ML IJ SOLN
5.0000 mg | INTRAMUSCULAR | Status: DC | PRN
  Administered 2015-10-14 (×2): 5 mg via INTRAVENOUS
  Filled 2015-10-14 (×2): qty 1

## 2015-10-14 MED ORDER — LACTATED RINGERS IV SOLN
INTRAVENOUS | Status: DC
  Administered 2015-10-14 – 2015-10-15 (×3): via INTRAVENOUS

## 2015-10-14 MED ORDER — EPHEDRINE 5 MG/ML INJ
10.0000 mg | INTRAVENOUS | Status: DC | PRN
  Filled 2015-10-14: qty 2

## 2015-10-14 MED ORDER — LIDOCAINE-EPINEPHRINE (PF) 2 %-1:200000 IJ SOLN
INTRAMUSCULAR | Status: DC | PRN
  Administered 2015-10-14: 3 mL

## 2015-10-14 MED ORDER — LIDOCAINE HCL (PF) 1 % IJ SOLN
30.0000 mL | INTRAMUSCULAR | Status: DC | PRN
  Filled 2015-10-14: qty 30

## 2015-10-14 NOTE — H&P (Signed)
Whitney BerkshireRavenne C Mawson is a 19 y.o. female, G1P0 at  weeks, presenting for Spontaneous rupture of membranes with irregular contractions at 1530.  Patient Active Problem List   Diagnosis Date Noted  . Normal labor 10/14/2015  . 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 Insufficient prenatal care - entered at 18 wk 10/14/2015    History of present pregnancy: Patient entered care at 4028w1d.   EDC of 10/24/15 was established by early US at 385w3d.   Anatomy scan:  646w1d on 06/07/15, with normal findings and an posterior placenta.    Additional US evaluations:  5683w1d,  07/05/15  EFW= 1 lb 8 oz. = 50.2 %tile.. Cx length= 3.19 cm. Frank breech  pres. Posterior placenta. WNL fluid- VP = 8.3 c. Cx closed.  PCI not well visualized. It previously appeared velamentous. 7381w2d - 08/31/15 - EFW 4 lbs 4 oz = 39.3%tile. FHT's 150 bpm. Singleton preg. Vertex pres. Posterior placenta. AFI WNL= 60th %tile. Cx closed Adnexas are unremarkable. PCI not well visualized due to advanced gestational age.  3176w6d 09/25/15   AFI 45th, posterior placenta, vertex presentation  Significant prenatal events:  Late to prenatal care at 18 wks    Last evaluation:  10/11/15   OB History    Gravida Para Term Preterm AB TAB SAB Ectopic Multiple Living   1 0        0     Past Medical History  Diagnosis Date  . Medical history non-contributory    Past Surgical History  Procedure Laterality Date  . Wisdom tooth extraction     Family History: family history is not on file. Social History:  reports that she has never smoked. She does not have any smokeless tobacco history on file. She reports that she does not drink alcohol or use illicit drugs.   Prenatal Transfer Tool  Maternal Diabetes: No Genetic Screening: Normal Maternal Ultrasounds/Referrals: Normal Fetal Ultrasounds or other Referrals:  None Maternal Substance Abuse:  No Significant Maternal Medications:  None Significant Maternal Lab Results: Lab values  include: Group B Strep negative  TDAP 08/03/15 Flu 09/12/15  ROS:  See above  No Known Allergies   Dilation: Closed (Ruptured) Effacement (%): Thick Exam by:: Alphonzo Severanceachel Caelynn Marshman Blood pressure 117/66, pulse 80, temperature 98.7 F (37.1 C), temperature source Oral, resp. rate 18, height 5\' 7"  (1.702 m), weight 91.173 kg (201 lb), last menstrual period 01/20/2015.  Chest clear Heart RRR without murmur Abd gravid, NT, FH 38 cm SROM, clear fluid, moderate amount   FHR: Category 1 UCs:  3-5 min, palpate mild  Prenatal labs: ABO, Rh:    O, positive Antibody:  negative Rubella:   immune RPR:   nonreactive HBsAg:   negative   HIV:   non reactive  GBS:  negative Sickle cell/Hgb electrophoresis:  N/A Pap:   GC:  negative Chlamydia:  negative Genetic screenings:   Glucola:  normal Other:    Hgb 12.6 at NOB weeks    Assessment/Plan: IUP at 3765w4d Late to prenatal care velamentous cord insertion SROM at 1530 Latent labor  Cat 1  Plan: Admit to Birthing Suite per consult with Dr. Dion BodyVarnado Routine [facility] orders Pain med/epidural prn  Expectant management  Beatrix Fettersachel StallCNM, MSN 10/14/2015, 7:58 PM

## 2015-10-14 NOTE — Anesthesia Procedure Notes (Signed)
Epidural Patient location during procedure: OB  Staffing Anesthesiologist: Kischa Altice Performed by: anesthesiologist   Preanesthetic Checklist Completed: patient identified, surgical consent, pre-op evaluation, timeout performed, IV checked, risks and benefits discussed and monitors and equipment checked  Epidural Patient position: sitting Prep: DuraPrep Patient monitoring: heart rate, cardiac monitor, continuous pulse ox and blood pressure Approach: midline Location: L3-L4 Injection technique: LOR saline  Needle:  Needle type: Tuohy  Needle gauge: 17 G Needle length: 9 cm Needle insertion depth: 7 cm Catheter type: closed end flexible Catheter size: 19 Gauge Catheter at skin depth: 12 cm Test dose: negative and 2% lidocaine with Epi 1:200 K  Assessment Events: blood not aspirated, injection not painful, no injection resistance, negative IV test and no paresthesia  Additional Notes Reason for block:procedure for pain   

## 2015-10-14 NOTE — MAU Note (Signed)
Urine in lab 

## 2015-10-14 NOTE — Anesthesia Preprocedure Evaluation (Signed)

## 2015-10-14 NOTE — Progress Notes (Signed)
Shanda BumpsJessica, CNM has been in to see pt.  Pt may eat  A light meal.  Order in.  Pt aware

## 2015-10-14 NOTE — Progress Notes (Signed)
Whitney Martin MRN: [id number]  Subjective: -Nurse call reports patient tearful and has had 2 doses of nubain.  In room to assess.  Patient tearful, reports contractions unbearable.  Requests epidural.   Objective: BP 106/55 mmHg  Pulse 107  Temp(Src) 97.8 F (36.6 C) (Oral)  Resp 20  Ht 5\' 7"  (1.702 m)  Wt 91.173 kg (201 lb)  BMI 31.47 kg/m2  LMP 01/20/2015     FHT: 135 bpm, Mod Var, -Decels, +Accels UC: palpates mild to moderate   SVE:   Dilation: 4 Effacement (%): 80 Station: -2 Exam by:: Anjana Cheek. CNM Membranes: SROM x 7hrs Pitocin: None  Assessment:  IUP at 38.4wks Cat I FT  Active Labor Desires Epidural  Plan: -Discussed VE -Okay for epidural -Discussed next cervical exam in 4 hours or at 0200 -Place foley catheter after epidural placement -Continue other mgmt as ordered  Whitney Bienkowski LYNN,MSN, CNM 10/14/2015, 10:21 PM

## 2015-10-14 NOTE — Progress Notes (Addendum)
Whitney BerkshireRavenne C Martin MRN: [id number]  Subjective: -Care assumed of 19 y.o. G1P0 at 3343w4d who presents for SROM. Patient is GBS negative and desires epidural when appropriate.  After discussion with previous CNM, informed that POC to allow for 12 hours of expectant mgmt prior to augmentation methods. In room to meet acquaintance, patient reports contractions.  Questions when pelvic exam will occur and how often.     Pregnancy also significant for  Patient Active Problem List   Diagnosis Date Noted  . Normal labor 10/14/2015  . 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 Insufficient prenatal care - entered at 18 wk 10/14/2015    Objective: BP 117/66 mmHg  Pulse 80  Temp(Src) 98.7 F (37.1 C) (Oral)  Resp 18  Ht 5\' 7"  (1.702 m)  Wt 91.173 kg (201 lb)  BMI 31.47 kg/m2  LMP 01/20/2015     FHT: 130 bpm, Mod Var, -Decels, +Accels UC: Q1-313min, palpates mild   SVE:  Deferred Membranes:SROM x 5 hours Pitocin: None  Assessment:  IUP at 38.4wks Cat I FT  PROM Expectant Mgmt GBS Negative  Plan: -Discussed POC to include cervical exam at midnight and 3am to decide on need for augmentation -Discussed need for decreased cervical exams to avoid increased risk for infection which could result in c/s, fetus needing NICU care, or maternal illness -Patient and FOB verbalizes understanding. -Patient questions ability to eat, discussed okay for light regular diet.   -No other questions or concerns -Continue other mgmt as ordered  Carlton Buskey LYNN,MSN, CNM 10/14/2015, 8:21 PM

## 2015-10-14 NOTE — MAU Note (Signed)
Patient presents at 638 weeks gestation with c/o ROM (clear fluid) at 1530 today. Fetus active. Denies bleeding.

## 2015-10-15 ENCOUNTER — Encounter (HOSPITAL_COMMUNITY): Payer: Self-pay | Admitting: *Deleted

## 2015-10-15 LAB — HIV ANTIBODY (ROUTINE TESTING W REFLEX): HIV SCREEN 4TH GENERATION: NONREACTIVE

## 2015-10-15 LAB — RPR: RPR Ser Ql: NONREACTIVE

## 2015-10-15 LAB — ABO/RH: ABO/RH(D): O POS

## 2015-10-15 MED ORDER — SIMETHICONE 80 MG PO CHEW: 80.0000 mg | CHEWABLE_TABLET | ORAL | Status: DC | PRN

## 2015-10-15 MED ORDER — PRENATAL MULTIVITAMIN CH
1.0000 | ORAL_TABLET | Freq: Every day | ORAL | Status: DC
  Administered 2015-10-16 – 2015-10-17 (×2): 1 via ORAL
  Filled 2015-10-15 (×2): qty 1

## 2015-10-15 MED ORDER — IBUPROFEN 600 MG PO TABS
600.0000 mg | ORAL_TABLET | Freq: Four times a day (QID) | ORAL | Status: DC
  Administered 2015-10-15 – 2015-10-17 (×8): 600 mg via ORAL
  Filled 2015-10-15 (×8): qty 1

## 2015-10-15 MED ORDER — ONDANSETRON HCL 4 MG PO TABS: 4.0000 mg | ORAL_TABLET | ORAL | Status: DC | PRN

## 2015-10-15 MED ORDER — ZOLPIDEM TARTRATE 5 MG PO TABS: 5.0000 mg | ORAL_TABLET | Freq: Every evening | ORAL | Status: DC | PRN

## 2015-10-15 MED ORDER — DIBUCAINE 1 % RE OINT: 1.0000 "application " | TOPICAL_OINTMENT | RECTAL | Status: DC | PRN

## 2015-10-15 MED ORDER — BENZOCAINE-MENTHOL 20-0.5 % EX AERO: 1.0000 "application " | INHALATION_SPRAY | CUTANEOUS | Status: DC | PRN

## 2015-10-15 MED ORDER — DIPHENHYDRAMINE HCL 25 MG PO CAPS: 25.0000 mg | ORAL_CAPSULE | Freq: Four times a day (QID) | ORAL | Status: DC | PRN

## 2015-10-15 MED ORDER — WITCH HAZEL-GLYCERIN EX PADS: 1.0000 "application " | MEDICATED_PAD | CUTANEOUS | Status: DC | PRN

## 2015-10-15 MED ORDER — LANOLIN HYDROUS EX OINT: TOPICAL_OINTMENT | CUTANEOUS | Status: DC | PRN

## 2015-10-15 MED ORDER — TETANUS-DIPHTH-ACELL PERTUSSIS 5-2.5-18.5 LF-MCG/0.5 IM SUSP: 0.5000 mL | Freq: Once | INTRAMUSCULAR | Status: DC

## 2015-10-15 MED ORDER — OXYCODONE-ACETAMINOPHEN 5-325 MG PO TABS: 2.0000 | ORAL_TABLET | ORAL | Status: DC | PRN

## 2015-10-15 MED ORDER — ONDANSETRON HCL 4 MG/2ML IJ SOLN: 4.0000 mg | INTRAMUSCULAR | Status: DC | PRN

## 2015-10-15 MED ORDER — SENNOSIDES-DOCUSATE SODIUM 8.6-50 MG PO TABS
2.0000 | ORAL_TABLET | ORAL | Status: DC
  Administered 2015-10-15 – 2015-10-16 (×2): 2 via ORAL
  Filled 2015-10-15 (×2): qty 2

## 2015-10-15 MED ORDER — ACETAMINOPHEN 325 MG PO TABS: 650.0000 mg | ORAL_TABLET | ORAL | Status: DC | PRN

## 2015-10-15 MED ORDER — OXYCODONE-ACETAMINOPHEN 5-325 MG PO TABS
1.0000 | ORAL_TABLET | ORAL | Status: DC | PRN
Start: 2015-10-15 — End: 2015-10-17
  Administered 2015-10-15 – 2015-10-17 (×4): 1 via ORAL
  Filled 2015-10-15 (×4): qty 1

## 2015-10-15 NOTE — Lactation Note (Signed)
This note was copied from the chart of BOY Dawsyn Macqueen. Lactation Consultation Note  Patient Name: BOY Wilford CornerRavenne Glas ZOXWR'UToday's Date: 10/15/2015 Reason for consult: Initial assessment Basic teaching reviewed with Mom. Assisted Mom with positioning and obtaining depth with latch. Encouraged to BF with feeding ques. Lactation brochure left for review, advised of OP services and support group. Encouraged to call for assist as needed.   Maternal Data Has patient been taught Hand Expression?: Yes Does the patient have breastfeeding experience prior to this delivery?: No  Feeding Feeding Type: Breast Fed  LATCH Score/Interventions Latch: Repeated attempts needed to sustain latch, nipple held in mouth throughout feeding, stimulation needed to elicit sucking reflex. Intervention(s): Adjust position;Assist with latch;Breast massage;Breast compression  Audible Swallowing: A few with stimulation  Type of Nipple: Everted at rest and after stimulation  Comfort (Breast/Nipple): Soft / non-tender     Hold (Positioning): Assistance needed to correctly position infant at breast and maintain latch. Intervention(s): Breastfeeding basics reviewed;Support Pillows;Position options;Skin to skin  LATCH Score: 7  Lactation Tools Discussed/Used     Consult Status Consult Status: Follow-up Date: 10/16/15 Follow-up type: In-patient    Alfred LevinsGranger, Lashauna Arpin Ann 10/15/2015, 6:03 PM

## 2015-10-15 NOTE — Progress Notes (Signed)
Subjective: Called to room.  Pt feeling more pressure.  Continues to feel back pain and using a PCA bolus to manage pain.  Does not feel the urge to push at this time.   Objective: BP 128/78 mmHg  Pulse 94  Temp(Src) 98.7 F (37.1 C) (Oral)  Resp 18  Ht 5\' 7"  (1.702 m)  Wt 91.173 kg (201 lb)  BMI 31.47 kg/m2  SpO2 98%  LMP 01/20/2015 I/O last 3 completed shifts: In: 1000 [Other:1000] Out: -     FHT: Category 1,   145 bpm + Accels, No decels,  UC:   regular, every 1-4 minutes SVE:   Dilation: 10 Effacement (%): 100 Station: 0 Exam by:: R. Gates Jividen, CNM    Assessment:  2nd stage of labor Cat 1 tracing  Plan: Continue plan of care- Expectant management Discussed Laboring down  Vs beginning to push.    Pt prefers to "labor down" until she feels an increased urge to push Hold next PCA bolus dose  Reassess in approximately an hour Dr. Normand Sloopillard updated, agrees with plan and is on standby   Whitney SeveranceRachel Laurisa Martin CNM, [place] 10/15/2015, 10:24 AM

## 2015-10-15 NOTE — Progress Notes (Signed)
Whitney Martin MRN: [id number]  Subjective: -Patient tearful in bed.  Reports pain in back.  Questions regarding who will be present for delivery.   Objective: BP 119/73 mmHg  Pulse 89  Temp(Src) 98.1 F (36.7 C) (Oral)  Resp 18  Ht 5\' 7"  (1.702 m)  Wt 91.173 kg (201 lb)  BMI 31.47 kg/m2  SpO2 98%  LMP 01/20/2015     FHT: 135 bpm, Mod Var, -Decels, +Accels UC: Q1-803min, palpates moderate   SVE:   Dilation: Lip/rim Effacement (%): 90 Station: -1 Exam by:: J.Montie Swiderski, Whitney Martin Membranes: AROM x 15 hrs Pitocin:None  Assessment:  IUP at 38.5wks Cat I FT  Active Labor-Transition Back Pain  Plan: -Discussed VE -Discussed delivery presence to include midwife and MD on call  -Will give PCA dose for back pain -No other questions or concerns -Continue other mgmt as ordered -Will report to oncoming provider: [name], Whitney Martin  Whitney Martin,Whitney Martin, Whitney Martin 10/15/2015, 6:22 AM

## 2015-10-16 LAB — CBC
HEMATOCRIT: 32.8 % — AB (ref 36.0–46.0)
HEMOGLOBIN: 10.7 g/dL — AB (ref 12.0–15.0)
MCH: 28.8 pg (ref 26.0–34.0)
MCHC: 32.6 g/dL (ref 30.0–36.0)
MCV: 88.2 fL (ref 78.0–100.0)
Platelets: 190 10*3/uL (ref 150–400)
RBC: 3.72 MIL/uL — AB (ref 3.87–5.11)
RDW: 14 % (ref 11.5–15.5)
WBC: 14.8 10*3/uL — AB (ref 4.0–10.5)

## 2015-10-16 NOTE — Progress Notes (Signed)
Subjective: Postpartum Day 1: Vaginal delivery, 1st degree vaginal laceration Patient up ad lib, reports no syncope or dizziness. Feeding:  Breast Contraceptive plan:  Undecided  Objective: Vital signs in last 24 hours: Temp:  [97.8 F (36.6 C)-99.2 F (37.3 C)] 97.8 F (36.6 C) (10/25 0648) Pulse Rate:  [68-101] 75 (10/25 0648) Resp:  [16-20] 18 (10/25 0648) BP: (112-157)/(54-143) 112/68 mmHg (10/25 [id number])  Physical Exam:  General: alert Lochia: appropriate Uterine Fundus: firm Perineum: healing well DVT Evaluation: No evidence of DVT seen on physical exam. Negative Homan's sign.   CBC Latest Ref Rng 10/16/2015 10/14/2015 03/03/2015  WBC 4.0 - 10.5 K/uL 14.8(H) 11.9(H) 11.1(H)  Hemoglobin 12.0 - 15.0 g/dL 10.7(L) 12.2 13.4  Hematocrit 36.0 - 46.0 % 32.8(L) 36.9 40.0  Platelets 150 - 400 K/uL 190 229 300     Assessment/Plan: Status post vaginal delivery day 1. Stable Continue current care. Plan for discharge tomorrow    Nyra CapesLATHAM, VICKICNM 10/16/2015, 11:32 AM

## 2015-10-16 NOTE — Lactation Note (Signed)
This note was copied from the chart of BOY Natavia Nebel. Lactation Consultation Note  Patient Name: BOY Wilford CornerRavenne Sallis ZOXWR'UToday's Date: 10/16/2015 Reason for consult: Follow-up assessment     With this mom of a term baby, now 3329 hours old. The baby was asleep, but mom allowed me to show her how to position for football hold . The baby latched easily, suckled for a couple of minutes, and went to sleep. Mom said the latch was comfortable. I reviewed hand expression with mom, and what a deep latch should look like. I also told mom about cluster feeding, which will probably start sometime tonight. Mom knows to call for questions/concerns.    Maternal Data    Feeding Feeding Type: Breast Fed Length of feed: 10 min  LATCH Score/Interventions Latch: Grasps breast easily, tongue down, lips flanged, rhythmical sucking. Intervention(s): Adjust position;Assist with latch  Audible Swallowing: A few with stimulation (baby sleepy aat this time) Intervention(s): Skin to skin;Hand expression  Type of Nipple: Everted at rest and after stimulation  Comfort (Breast/Nipple): Soft / non-tender  Problem noted: Mild/Moderate discomfort (seems to be coming from loose latch) Interventions (Mild/moderate discomfort): Hand massage;Hand expression  Hold (Positioning): Assistance needed to correctly position infant at breast and maintain latch. Intervention(s): Breastfeeding basics reviewed;Support Pillows;Position options;Skin to skin  LATCH Score: 8  Lactation Tools Discussed/Used     Consult Status Consult Status: Follow-up Date: 10/17/15 Follow-up type: In-patient    Alfred LevinsLee, Joeline Freer Anne 10/16/2015, 6:33 PM

## 2015-10-16 NOTE — Anesthesia Postprocedure Evaluation (Signed)
  Anesthesia Post-op Note  Patient: Whitney Martin  Procedure(s) Performed: * No procedures listed *  Patient Location: PACU and Mother/Baby  Anesthesia Type:Epidural  Level of Consciousness: awake, alert  and oriented  Airway and Oxygen Therapy: Patient Spontanous Breathing  Post-op Pain: mild  Post-op Assessment: Patient's Cardiovascular Status Stable, Respiratory Function Stable, No signs of Nausea or vomiting, Adequate PO intake, Pain level controlled, No headache, No backache and Patient able to bend at knees              Post-op Vital Signs: Reviewed and stable  Last Vitals:  Filed Vitals:   10/16/15 0648  BP: 112/68  Pulse: 75  Temp: 36.6 C  Resp: 18    Complications: No apparent anesthesia complications

## 2015-10-16 NOTE — Progress Notes (Signed)
UR chart review completed.  

## 2015-10-17 MED ORDER — FERROUS SULFATE 325 (65 FE) MG PO TBEC: 325.0000 mg | DELAYED_RELEASE_TABLET | Freq: Two times a day (BID) | ORAL | Status: AC

## 2015-10-17 MED ORDER — OXYCODONE-ACETAMINOPHEN 5-325 MG PO TABS: 1.0000 | ORAL_TABLET | ORAL | Status: DC | PRN

## 2015-10-17 MED ORDER — IBUPROFEN 600 MG PO TABS: 600.0000 mg | ORAL_TABLET | Freq: Four times a day (QID) | ORAL | Status: DC

## 2015-10-17 NOTE — Discharge Summary (Signed)
OB Discharge Summary  Patient Name: Whitney Martin DOB: 10-06-1996 MRN: [id number]  Date of admission: 10/14/2015 Delivering MD: Alphonzo SeveranceSTALL, RACHEL   Date of discharge: 10/17/2015  Admitting diagnosis: 38 WKS, WATER BROKE Intrauterine pregnancy: 5928w5d     Secondary diagnosis:Principal Problem:   Vaginal delivery Active Problems:   Velamentous insertion of umbilical cord   Insufficient prenatal care - entered at 18 wk  Additional problems:none     Discharge diagnosis: Term Pregnancy Delivered                                                                     Post partum procedures:none  Augmentation: AROM  Complications: None  Hospital course:  Onset of Labor With Vaginal Delivery     19 y.o. yo G1P1001 at 4628w5d was admitted in Latent Laboron 10/14/2015. Patient had an uncomplicated labor course as follows:  Membrane Rupture Time/Date: 3:30 PM ,10/14/2015   Intrapartum Procedures: Episiotomy: None [1]                                         Lacerations:     Patient had a delivery of a Viable infant. 10/15/2015  Information for the patient's newborn:  Whitney Martin, BOY Whitney Martin [[id number]]  Delivery Method: Vaginal, Spontaneous Delivery (Filed from Delivery Summary)    Pateint had an uncomplicated postpartum course.  She is ambulating, tolerating a regular diet, passing flatus, and urinating well. Patient is discharged home in stable condition on 10/17/15    Physical exam  Filed Vitals:   10/15/15 2007 10/16/15 0648 10/16/15 1855 10/17/15 0542  BP: 118/54 112/68 113/64 116/66  Pulse: 69 75 83 72  Temp: 97.8 F (36.6 C) 97.8 F (36.6 C) 98.3 F (36.8 C) 97.5 F (36.4 C)  TempSrc: Oral Oral Oral Oral  Resp: 18 18 18 18   Height:      Weight:      SpO2:       General: alert, cooperative and no distress Lochia: appropriate Uterine Fundus: firm Incision: n/a DVT Evaluation: No evidence of DVT seen on physical exam. Labs: Lab Results  Component Value Date   WBC 14.8* 10/16/2015   HGB 10.7* 10/16/2015   HCT 32.8* 10/16/2015   MCV 88.2 10/16/2015   PLT 190 10/16/2015   CMP Latest Ref Rng 03/03/2015  Glucose 70 - 99 mg/dL 94  BUN 6 - 23 mg/dL 9  Creatinine 7.820.50 - 9.561.10 mg/dL 2.130.70  Sodium 086135 - 578145 mmol/L 137  Potassium 3.5 - 5.1 mmol/L 4.1  Chloride 96 - 112 mmol/L 105  CO2 19 - 32 mmol/L 26  Calcium 8.4 - 10.5 mg/dL 9.3  Total Protein 6.0 - 8.3 g/dL 6.8  Total Bilirubin 0.3 - 1.2 mg/dL 4.6(N0.2(L)  Alkaline Phos 39 - 117 U/L 51  AST 0 - 37 U/L 16  ALT 0 - 35 U/L 9    Discharge instruction: per After Visit Summary and "Baby and Me Booklet".  Medications:  Current facility-administered medications:  .  acetaminophen (TYLENOL) tablet 650 mg, 650 mg, Oral, Q4H PRN, Alphonzo Severanceachel Stall, CNM .  benzocaine-Menthol (DERMOPLAST) 20-0.5 % topical spray 1 application, 1 application,  Topical, PRN, Alphonzo Severance, CNM .  witch hazel-glycerin (TUCKS) pad 1 application, 1 application, Topical, PRN **AND** dibucaine (NUPERCAINAL) 1 % rectal ointment 1 application, 1 application, Rectal, PRN, Alphonzo Severance, CNM .  diphenhydrAMINE (BENADRYL) capsule 25 mg, 25 mg, Oral, Q6H PRN, Alphonzo Severance, CNM .  ibuprofen (ADVIL,MOTRIN) tablet 600 mg, 600 mg, Oral, 4 times per day, Alphonzo Severance, CNM, 600 mg at 10/17/15 0522 .  lanolin ointment, , Topical, PRN, Alphonzo Severance, CNM .  ondansetron (ZOFRAN) tablet 4 mg, 4 mg, Oral, Q4H PRN **OR** ondansetron (ZOFRAN) injection 4 mg, 4 mg, Intravenous, Q4H PRN, Alphonzo Severance, CNM .  oxyCODONE-acetaminophen (PERCOCET/ROXICET) 5-325 MG per tablet 1 tablet, 1 tablet, Oral, Q4H PRN, Alphonzo Severance, CNM, 1 tablet at 10/17/15 0444 .  oxyCODONE-acetaminophen (PERCOCET/ROXICET) 5-325 MG per tablet 2 tablet, 2 tablet, Oral, Q4H PRN, Alphonzo Severance, CNM .  prenatal multivitamin tablet 1 tablet, 1 tablet, Oral, Q1200, Alphonzo Severance, CNM, 1 tablet at 10/16/15 1155 .  senna-docusate (Senokot-S) tablet 2 tablet, 2 tablet, Oral, Q24H, Alphonzo Severance, CNM, 2  tablet at 10/16/15 2355 .  simethicone (MYLICON) chewable tablet 80 mg, 80 mg, Oral, PRN, Alphonzo Severance, CNM .  Tdap (BOOSTRIX) injection 0.5 mL, 0.5 mL, Intramuscular, Once, Alphonzo Severance, CNM .  zolpidem (AMBIEN) tablet 5 mg, 5 mg, Oral, QHS PRN, Alphonzo Severance, CNM  Facility-Administered Medications Ordered in Other Encounters:  .  bupivacaine (PF) (MARCAINE) 0.25 % injection, , , Anesthesia Intra-op, Val Eagle, MD, 4 mL at 10/14/15 2254 .  lidocaine-EPINEPHrine (XYLOCAINE W/EPI) 2 %-1:200000 (PF) injection, , Other, Anesthesia Intra-op, Val Eagle, MD, 3 mL at 10/14/15 2247 After Visit Meds:    Medication List    ASK your doctor about these medications        acetaminophen 325 MG tablet  Commonly known as:  TYLENOL  Take 650 mg by mouth every 6 (six) hours as needed for mild pain or headache.     prenatal multivitamin Tabs tablet  Take 1 tablet by mouth at bedtime.     PRESCRIPTION MEDICATION  Take 1 tablet by mouth daily as needed. Pt states that she uses a medication for acid reflux, but she is unsure of the name of it.  Can't verify with the pharmacy because they are closed.     ZOFRAN PO  Take 1 tablet by mouth as needed (for nausea/vomiting). Pt is unsure of the dose and I can't verify with the pharmacy because they are closed.        Diet: routine diet  Activity: Advance as tolerated. Pelvic rest for 6 weeks.   Outpatient follow up:6 weeks Follow up Appt:No future appointments. Follow up visit: No Follow-up on file.  Postpartum contraception: Undecided  Newborn Data: Live born female  Birth Weight: 7 lb 7.6 oz (3390 g) APGAR: 9, 9  Baby Feeding: Breast Disposition:home with mother   10/17/2015 Whitney Martin, CNM  Postpartum Care After Vaginal Delivery  After you deliver your newborn (postpartum period), the usual stay in the hospital is 24 72 hours. If there were problems with your labor or delivery, or if you have other medical problems, you  might be in the hospital longer.  While you are in the hospital, you will receive help and instructions on how to care for yourself and your newborn during the postpartum period.  While you are in the hospital:  Be sure to tell your nurses if you have pain or discomfort, as well as where you feel the pain and what  makes the pain worse.  If you had an incision made near your vagina (episiotomy) or if you had some tearing during delivery, the nurses may put ice packs on your episiotomy or tear. The ice packs may help to reduce the pain and swelling.  If you are breastfeeding, you may feel uncomfortable contractions of your uterus for a couple of weeks. This is normal. The contractions help your uterus get back to normal size.  It is normal to have some bleeding after delivery.  For the first 1 3 days after delivery, the flow is red and the amount may be similar to a period.  It is common for the flow to start and stop.  In the first few days, you may pass some small clots. Let your nurses know if you begin to pass large clots or your flow increases.  Do not  flush blood clots down the toilet before having the nurse look at them.  During the next 3 10 days after delivery, your flow should become more watery and pink or brown-tinged in color.  Ten to fourteen days after delivery, your flow should be a small amount of yellowish-white discharge.  The amount of your flow will decrease over the first few weeks after delivery. Your flow may stop in 6 8 weeks. Most women have had their flow stop by 12 weeks after delivery.  You should change your sanitary pads frequently.  Wash your hands thoroughly with soap and water for at least 20 seconds after changing pads, using the toilet, or before holding or feeding your newborn.  You should feel like you need to empty your bladder within the first 6 8 hours after delivery.  In case you become weak, lightheaded, or faint, call your nurse before you  get out of bed for the first time and before you take a shower for the first time.  Within the first few days after delivery, your breasts may begin to feel tender and full. This is called engorgement. Breast tenderness usually goes away within 48 72 hours after engorgement occurs. You may also notice milk leaking from your breasts. If you are not breastfeeding, do not stimulate your breasts. Breast stimulation can make your breasts produce more milk.  Spending as much time as possible with your newborn is very important. During this time, you and your newborn can feel close and get to know each other. Having your newborn stay in your room (rooming in) will help to strengthen the bond with your newborn. It will give you time to get to know your newborn and become comfortable caring for your newborn.  Your hormones change after delivery. Sometimes the hormone changes can temporarily cause you to feel sad or tearful. These feelings should not last more than a few days. If these feelings last longer than that, you should talk to your caregiver.  If desired, talk to your caregiver about methods of family planning or contraception.  Talk to your caregiver about immunizations. Your caregiver may want you to have the following immunizations before leaving the hospital:  Tetanus, diphtheria, and pertussis (Tdap) or tetanus and diphtheria (Td) immunization. It is very important that you and your family (including grandparents) or others caring for your newborn are up-to-date with the Tdap or Td immunizations. The Tdap or Td immunization can help protect your newborn from getting ill.  Rubella immunization.  Varicella (chickenpox) immunization.  Influenza immunization. You should receive this annual immunization if you did not receive the immunization during  your pregnancy. Document Released: 10/05/2007 Document Revised: 09/01/2012 Document Reviewed: 08/04/2012 [facility] Patient Information 2014  [place], [place].   Postpartum Depression and Baby Blues  The postpartum period begins right after the birth of a baby. During this time, there is often a great amount of joy and excitement. It is also a time of considerable changes in the life of the parent(s). Regardless of how many times a mother gives birth, each child brings new challenges and dynamics to the family. It is not unusual to have feelings of excitement accompanied by confusing shifts in moods, emotions, and thoughts. All mothers are at risk of developing postpartum depression or the "baby blues." These mood changes can occur right after giving birth, or they may occur many months after giving birth. The baby blues or postpartum depression can be mild or severe. Additionally, postpartum depression can resolve rather quickly, or it can be a long-term condition. CAUSES Elevated hormones and their rapid decline are thought to be a main cause of postpartum depression and the baby blues. There are a number of hormones that radically change during and after pregnancy. Estrogen and progesterone usually decrease immediately after delivering your baby. The level of thyroid hormone and various cortisol steroids also rapidly drop. Other factors that play a major role in these changes include major life events and genetics.  RISK FACTORS If you have any of the following risks for the baby blues or postpartum depression, know what symptoms to watch out for during the postpartum period. Risk factors that may increase the likelihood of getting the baby blues or postpartum depression include:  Havinga personal or family history of depression.  Having depression while being pregnant.  Having premenstrual or oral contraceptive-associated mood issues.  Having exceptional life stress.  Having marital conflict.  Lacking a social support network.  Having a baby with special needs.  Having health problems such as diabetes. SYMPTOMS Baby blues  symptoms include:  Brief fluctuations in mood, such as going from extreme happiness to sadness.  Decreased concentration.  Difficulty sleeping.  Crying spells, tearfulness.  Irritability.  Anxiety. Postpartum depression symptoms typically begin within the first month after giving birth. These symptoms include:  Difficulty sleeping or excessive sleepiness.  Marked weight loss.  Agitation.  Feelings of worthlessness.  Lack of interest in activity or food. Postpartum psychosis is a very concerning condition and can be dangerous. Fortunately, it is rare. Displaying any of the following symptoms is cause for immediate medical attention. Postpartum psychosis symptoms include:  Hallucinations and delusions.  Bizarre or disorganized behavior.  Confusion or disorientation. DIAGNOSIS  A diagnosis is made by an evaluation of your symptoms. There are no medical or lab tests that lead to a diagnosis, but there are various questionnaires that a caregiver may use to identify those with the baby blues, postpartum depression, or psychosis. Often times, a screening tool called the [place] Postnatal Depression Scale is used to diagnose depression in the postpartum period.  TREATMENT The baby blues usually goes away on its own in 1 to 2 weeks. Social support is often all that is needed. You should be encouraged to get adequate sleep and rest. Occasionally, you may be given medicines to help you sleep.  Postpartum depression requires treatment as it can last several months or longer if it is not treated. Treatment may include individual or group therapy, medicine, or both to address any social, physiological, and psychological factors that may play a role in the depression. Regular exercise, a healthy diet, rest,  and social support may also be strongly recommended.  Postpartum psychosis is more serious and needs treatment right away. Hospitalization is often needed. HOME CARE INSTRUCTIONS  Get as  much rest as you can. Nap when the baby sleeps.  Exercise regularly. Some women find yoga and walking to be beneficial.  Eat a balanced and nourishing diet.  Do little things that you enjoy. Have a cup of tea, take a bubble bath, read your favorite magazine, or listen to your favorite music.  Avoid alcohol.  Ask for help with household chores, cooking, grocery shopping, or running errands as needed. Do not try to do everything.  Talk to people close to you about how you are feeling. Get support from your partner, family members, friends, or other new moms.  Try to stay positive in how you think. Think about the things you are grateful for.  Do not spend a lot of time alone.  Only take medicine as directed by your caregiver.  Keep all your postpartum appointments.  Let your caregiver know if you have any concerns. SEEK MEDICAL CARE IF: You are having a reaction or problems with your medicine. SEEK IMMEDIATE MEDICAL CARE IF:  You have suicidal feelings.  You feel you may harm the baby or someone else. Document Released: 09/11/2004 Document Revised: 03/01/2012 Document Reviewed: 10/14/2011 [facility] Patient Information 2014 [place], [place].     Breastfeeding Deciding to breastfeed is one of the best choices you can make for you and your baby. A change in hormones during pregnancy causes your breast tissue to grow and increases the number and size of your milk ducts. These hormones also allow proteins, sugars, and fats from your blood supply to make breast milk in your milk-producing glands. Hormones prevent breast milk from being released before your baby is born as well as prompt milk flow after birth. Once breastfeeding has begun, thoughts of your baby, as well as his or her sucking or crying, can stimulate the release of milk from your milk-producing glands.  BENEFITS OF BREASTFEEDING For Your Baby  Your first milk (colostrum) helps your baby's digestive system function  better.   There are antibodies in your milk that help your baby fight off infections.   Your baby has a lower incidence of asthma, allergies, and sudden infant death syndrome.   The nutrients in breast milk are better for your baby than infant formulas and are designed uniquely for your baby's needs.   Breast milk improves your baby's brain development.   Your baby is less likely to develop other conditions, such as childhood obesity, asthma, or type 2 diabetes mellitus.  For You   Breastfeeding helps to create a very special bond between you and your baby.   Breastfeeding is convenient. Breast milk is always available at the correct temperature and costs nothing.   Breastfeeding helps to burn calories and helps you lose the weight gained during pregnancy.   Breastfeeding makes your uterus contract to its prepregnancy size faster and slows bleeding (lochia) after you give birth.   Breastfeeding helps to lower your risk of developing type 2 diabetes mellitus, osteoporosis, and breast or ovarian cancer later in life. SIGNS THAT YOUR BABY IS HUNGRY Early Signs of Hunger  Increased alertness or activity.  Stretching.  Movement of the head from side to side.  Movement of the head and opening of the mouth when the corner of the mouth or cheek is stroked (rooting).  Increased sucking sounds, smacking lips, cooing, sighing, or squeaking.  Hand-to-mouth movements.  Increased sucking of fingers or hands. Late Signs of Hunger  Fussing.  Intermittent crying. Extreme Signs of Hunger Signs of extreme hunger will require calming and consoling before your baby will be able to breastfeed successfully. Do not wait for the following signs of extreme hunger to occur before you initiate breastfeeding:   Restlessness.  A loud, strong cry.   Screaming.   BREASTFEEDING BASICS Breastfeeding Initiation  Find a comfortable place to sit or lie down, with your neck and back  well supported.  Place a pillow or rolled up blanket under your baby to bring him or her to the level of your breast (if you are seated). Nursing pillows are specially designed to help support your arms and your baby while you breastfeed.  Make sure that your baby's abdomen is facing your abdomen.   Gently massage your breast. With your fingertips, massage from your chest wall toward your nipple in a circular motion. This encourages milk flow. You may need to continue this action during the feeding if your milk flows slowly.  Support your breast with 4 fingers underneath and your thumb above your nipple. Make sure your fingers are well away from your nipple and your baby's mouth.   Stroke your baby's lips gently with your finger or nipple.   When your baby's mouth is open wide enough, quickly bring your baby to your breast, placing your entire nipple and as much of the colored area around your nipple (areola) as possible into your baby's mouth.   More areola should be visible above your baby's upper lip than below the lower lip.   Your baby's tongue should be between his or her lower gum and your breast.   Ensure that your baby's mouth is correctly positioned around your nipple (latched). Your baby's lips should create a seal on your breast and be turned out (everted).  It is common for your baby to suck about 2-3 minutes in order to start the flow of breast milk. Latching Teaching your baby how to latch on to your breast properly is very important. An improper latch can cause nipple pain and decreased milk supply for you and poor weight gain in your baby. Also, if your baby is not latched onto your nipple properly, he or she may swallow some air during feeding. This can make your baby fussy. Burping your baby when you switch breasts during the feeding can help to get rid of the air. However, teaching your baby to latch on properly is still the best way to prevent fussiness from  swallowing air while breastfeeding. Signs that your baby has successfully latched on to your nipple:    Silent tugging or silent sucking, without causing you pain.   Swallowing heard between every 3-4 sucks.    Muscle movement above and in front of his or her ears while sucking.  Signs that your baby has not successfully latched on to nipple:   Sucking sounds or smacking sounds from your baby while breastfeeding.  Nipple pain. If you think your baby has not latched on correctly, slip your finger into the corner of your baby's mouth to break the suction and place it between your baby's gums. Attempt breastfeeding initiation again. Signs of Successful Breastfeeding Signs from your baby:   A gradual decrease in the number of sucks or complete cessation of sucking.   Falling asleep.   Relaxation of his or her body.   Retention of a small amount of milk in  his or her mouth.   Letting go of your breast by himself or herself. Signs from you:  Breasts that have increased in firmness, weight, and size 1-3 hours after feeding.   Breasts that are softer immediately after breastfeeding.  Increased milk volume, as well as a change in milk consistency and color by the fifth day of breastfeeding.   Nipples that are not sore, cracked, or bleeding. Signs That Your Pecola Leisure is Getting Enough Milk  Wetting at least 3 diapers in a 24-hour period. The urine should be clear and pale yellow by age 15 days.  At least 3 stools in a 24-hour period by age 15 days. The stool should be soft and yellow.  At least 3 stools in a 24-hour period by age 79 days. The stool should be seedy and yellow.  No loss of weight greater than 10% of birth weight during the first 73 days of age.  Average weight gain of 4-7 ounces (113-198 g) per week after age 75 days.  Consistent daily weight gain by age 15 days, without weight loss after the age of 2 weeks. After a feeding, your baby may spit up a small amount.  This is common. BREASTFEEDING FREQUENCY AND DURATION Frequent feeding will help you make more milk and can prevent sore nipples and breast engorgement. Breastfeed when you feel the need to reduce the fullness of your breasts or when your baby shows signs of hunger. This is called "breastfeeding on demand." Avoid introducing a pacifier to your baby while you are working to establish breastfeeding (the first 4-6 weeks after your baby is born). After this time you may choose to use a pacifier. Research has shown that pacifier use during the first year of a baby's life decreases the risk of sudden infant death syndrome (SIDS). Allow your baby to feed on each breast as long as he or she wants. Breastfeed until your baby is finished feeding. When your baby unlatches or falls asleep while feeding from the first breast, offer the second breast. Because newborns are often sleepy in the first few weeks of life, you may need to awaken your baby to get him or her to feed. Breastfeeding times will vary from baby to baby. However, the following rules can serve as a guide to help you ensure that your baby is properly fed:  Newborns (babies 27 weeks of age or younger) may breastfeed every 1-3 hours.  Newborns should not go longer than 3 hours during the day or 5 hours during the night without breastfeeding.  You should breastfeed your baby a minimum of 8 times in a 24-hour period until you begin to introduce solid foods to your baby at around 45 months of age. BREAST MILK PUMPING Pumping and storing breast milk allows you to ensure that your baby is exclusively fed your breast milk, even at times when you are unable to breastfeed. This is especially important if you are going back to work while you are still breastfeeding or when you are not able to be present during feedings. Your lactation consultant can give you guidelines on how long it is safe to store breast milk.  A breast pump is a machine that allows you to pump  milk from your breast into a sterile bottle. The pumped breast milk can then be stored in a refrigerator or freezer. Some breast pumps are operated by hand, while others use electricity. Ask your lactation consultant which type will work best for you. Breast pumps can  be purchased, but some hospitals and breastfeeding support groups lease breast pumps on a monthly basis. A lactation consultant can teach you how to hand express breast milk, if you prefer not to use a pump.  CARING FOR YOUR BREASTS WHILE YOU BREASTFEED Nipples can become dry, cracked, and sore while breastfeeding. The following recommendations can help keep your breasts moisturized and healthy:  Avoid using soap on your nipples.   Wear a supportive bra. Although not required, special nursing bras and tank tops are designed to allow access to your breasts for breastfeeding without taking off your entire bra or top. Avoid wearing underwire-style bras or extremely tight bras.  Air dry your nipples for 3-9minutes after each feeding.   Use only cotton bra pads to absorb leaked breast milk. Leaking of breast milk between feedings is normal.   Use lanolin on your nipples after breastfeeding. Lanolin helps to maintain your skin's normal moisture barrier. If you use pure lanolin, you do not need to wash it off before feeding your baby again. Pure lanolin is not toxic to your baby. You may also hand express a few drops of breast milk and gently massage that milk into your nipples and allow the milk to air dry. In the first few weeks after giving birth, some women experience extremely full breasts (engorgement). Engorgement can make your breasts feel heavy, warm, and tender to the touch. Engorgement peaks within 3-5 days after you give birth. The following recommendations can help ease engorgement:  Completely empty your breasts while breastfeeding or pumping. You may want to start by applying warm, moist heat (in the shower or with warm  water-soaked hand towels) just before feeding or pumping. This increases circulation and helps the milk flow. If your baby does not completely empty your breasts while breastfeeding, pump any extra milk after he or she is finished.  Wear a snug bra (nursing or regular) or tank top for 1-2 days to signal your body to slightly decrease milk production.  Apply ice packs to your breasts, unless this is too uncomfortable for you.  Make sure that your baby is latched on and positioned properly while breastfeeding. If engorgement persists after 48 hours of following these recommendations, contact your health care provider or a Advertising copywriter. OVERALL HEALTH CARE RECOMMENDATIONS WHILE BREASTFEEDING  Eat healthy foods. Alternate between meals and snacks, eating 3 of each per day. Because what you eat affects your breast milk, some of the foods may make your baby more irritable than usual. Avoid eating these foods if you are sure that they are negatively affecting your baby.  Drink milk, fruit juice, and water to satisfy your thirst (about 10 glasses a day).   Rest often, relax, and continue to take your prenatal vitamins to prevent fatigue, stress, and anemia.  Continue breast self-awareness checks.  Avoid chewing and smoking tobacco.  Avoid alcohol and drug use. Some medicines that may be harmful to your baby can pass through breast milk. It is important to ask your health care provider before taking any medicine, including all over-the-counter and prescription medicine as well as vitamin and herbal supplements. It is possible to become pregnant while breastfeeding. If birth control is desired, ask your health care provider about options that will be safe for your baby. SEEK MEDICAL CARE IF:   You feel like you want to stop breastfeeding or have become frustrated with breastfeeding.  You have painful breasts or nipples.  Your nipples are cracked or bleeding.  Your breasts  are red,  tender, or warm.  You have a swollen area on either breast.  You have a fever or chills.  You have nausea or vomiting.  You have drainage other than breast milk from your nipples.  Your breasts do not become full before feedings by the fifth day after you give birth.  You feel sad and depressed.  Your baby is too sleepy to eat well.  Your baby is having trouble sleeping.   Your baby is wetting less than 3 diapers in a 24-hour period.  Your baby has less than 3 stools in a 24-hour period.  Your baby's skin or the white part of his or her eyes becomes yellow.   Your baby is not gaining weight by 72 days of age. SEEK IMMEDIATE MEDICAL CARE IF:   Your baby is overly tired (lethargic) and does not want to wake up and feed.  Your baby develops an unexplained fever. Document Released: 12/08/2005 Document Revised: 12/13/2013 Document Reviewed: 06/01/2013 [facility] Patient Information 2015 [place], [place]. This information is not intended to replace advice given to you by your health care provider. Make sure you discuss any questions you have with your health care provider.

## 2017-12-12 ENCOUNTER — Emergency Department (HOSPITAL_COMMUNITY): Payer: Medicaid Other

## 2017-12-12 ENCOUNTER — Encounter (HOSPITAL_COMMUNITY): Payer: Self-pay | Admitting: Emergency Medicine

## 2017-12-12 ENCOUNTER — Emergency Department (HOSPITAL_COMMUNITY)
Admission: EM | Admit: 2017-12-12 | Discharge: 2017-12-13 | Disposition: A | Discharge status: Home / Self Care | Payer: Medicaid Other | Attending: Emergency Medicine | Admitting: Emergency Medicine

## 2017-12-12 DIAGNOSIS — O9989 Other specified diseases and conditions complicating pregnancy, childbirth and the puerperium: Secondary | ICD-10-CM | POA: Insufficient documentation

## 2017-12-12 DIAGNOSIS — O209 Hemorrhage in early pregnancy, unspecified: Secondary | ICD-10-CM | POA: Diagnosis present

## 2017-12-12 DIAGNOSIS — O468X1 Other antepartum hemorrhage, first trimester: Secondary | ICD-10-CM | POA: Diagnosis not present

## 2017-12-12 DIAGNOSIS — O200 Threatened abortion: Secondary | ICD-10-CM

## 2017-12-12 DIAGNOSIS — IMO0001 Reserved for inherently not codable concepts without codable children: Secondary | ICD-10-CM

## 2017-12-12 DIAGNOSIS — Z3A12 12 weeks gestation of pregnancy: Secondary | ICD-10-CM | POA: Diagnosis not present

## 2017-12-12 DIAGNOSIS — Z79899 Other long term (current) drug therapy: Secondary | ICD-10-CM | POA: Insufficient documentation

## 2017-12-12 DIAGNOSIS — O418X11 Other specified disorders of amniotic fluid and membranes, first trimester, fetus 1: Secondary | ICD-10-CM | POA: Insufficient documentation

## 2017-12-12 DIAGNOSIS — O4690 Antepartum hemorrhage, unspecified, unspecified trimester: Secondary | ICD-10-CM

## 2017-12-12 DIAGNOSIS — O418X10 Other specified disorders of amniotic fluid and membranes, first trimester, not applicable or unspecified: Secondary | ICD-10-CM

## 2017-12-12 DIAGNOSIS — R109 Unspecified abdominal pain: Secondary | ICD-10-CM | POA: Insufficient documentation

## 2017-12-12 LAB — BASIC METABOLIC PANEL
Anion gap: 9 (ref 5–15)
BUN: 7 mg/dL (ref 6–20)
CALCIUM: 9.2 mg/dL (ref 8.9–10.3)
CHLORIDE: 102 mmol/L (ref 101–111)
CO2: 23 mmol/L (ref 22–32)
CREATININE: 0.52 mg/dL (ref 0.44–1.00)
GFR calc non Af Amer: >60 mL/min (ref >60)
Glucose, Bld: 83 mg/dL (ref 65–99)
Potassium: 4.2 mmol/L (ref 3.5–5.1)
SODIUM: 134 mmol/L — AB (ref 135–145)

## 2017-12-12 LAB — CBC
HCT: 40 % (ref 36.0–46.0)
Hemoglobin: 13.2 g/dL (ref 12.0–15.0)
MCH: 29.4 pg (ref 26.0–34.0)
MCHC: 33 g/dL (ref 30.0–36.0)
MCV: 89.1 fL (ref 78.0–100.0)
PLATELETS: 278 10*3/uL (ref 150–400)
RBC: 4.49 MIL/uL (ref 3.87–5.11)
RDW: 12.5 % (ref 11.5–15.5)
WBC: 9.5 10*3/uL (ref 4.0–10.5)

## 2017-12-12 LAB — HCG, QUANTITATIVE, PREGNANCY: hCG, Beta Chain, Quant, S: 34663 m[IU]/mL — ABNORMAL HIGH (ref <5)

## 2017-12-12 NOTE — ED Triage Notes (Signed)
Pt states she is [redacted] weeks pregnant and about 1 hour ago she started having some bright red bleeding and lower abd cramping. Pt has normal US 2 weeks ago.

## 2017-12-13 ENCOUNTER — Emergency Department (HOSPITAL_COMMUNITY): Payer: Medicaid Other

## 2017-12-13 LAB — URINALYSIS, ROUTINE W REFLEX MICROSCOPIC
BILIRUBIN URINE: NEGATIVE
GLUCOSE, UA: NEGATIVE mg/dL
Ketones, ur: 5 mg/dL — AB
LEUKOCYTES UA: NEGATIVE
NITRITE: NEGATIVE
PROTEIN: NEGATIVE mg/dL
SPECIFIC GRAVITY, URINE: 1.012 (ref 1.005–1.030)
pH: 6 (ref 5.0–8.0)

## 2017-12-13 NOTE — ED Provider Notes (Signed)
[facility] EMERGENCY DEPARTMENT Provider Note   CSN: [id number] Arrival date & time: 12/12/17  1658     History   Chief Complaint Chief Complaint  Patient presents with  . Threatened Miscarriage    HPI Whitney Martin is a 21 y.o. female.  HPI  This is a 21 year old G2 P1 female approximately [redacted] weeks pregnant who presents with vaginal bleeding.  Reports onset of bleeding while at work earlier this evening.  She reports bright red blood in her underwear.  She has used 1 pad but has not soaked through that pad.  She reports normal ultrasound several weeks ago.  She is followed by Nestor Ramp.  Reports that her blood type is O+.  Prior pregnancy without complication.  She reports some mild abdominal cramping.  Denies any lateralizing abdominal pain.  Past Medical History:  Diagnosis Date  . Medical history non-contributory     Patient Active Problem List   Diagnosis Date Noted  . Vaginal delivery 10/15/2015  . 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 Insufficient prenatal care - entered at 44 wk 10/14/2015    Past Surgical History:  Procedure Laterality Date  . WISDOM TOOTH EXTRACTION      OB History    Gravida Para Term Preterm AB Living   2 1 1     1    SAB TAB Ectopic Multiple Live Births         0 1       Home Medications    Prior to Admission medications   Medication Sig Start Date End Date Taking? Authorizing Provider  ibuprofen (ADVIL,MOTRIN) 600 MG tablet Take 1 tablet (600 mg total) by mouth every 6 (six) hours. 10/17/15   Standard, Venus, CNM  oxyCODONE-acetaminophen (PERCOCET/ROXICET) 5-325 MG tablet Take 1 tablet by mouth every 4 (four) hours as needed (for pain scale 4-7). 10/17/15   Standard, Venus, CNM  Prenatal Vit-Fe Fumarate-FA (PRENATAL MULTIVITAMIN) TABS tablet Take 1 tablet by mouth at bedtime.    [provider]    Family History No family history on file.  Social History Social History    Tobacco Use  . Smoking status: Never Smoker  Substance Use Topics  . Alcohol use: No  . Drug use: No     Allergies   Patient has no known allergies.   Review of Systems Review of Systems  Constitutional: Negative for fever.  Gastrointestinal: Positive for abdominal pain. Negative for nausea and vomiting.  Genitourinary: Positive for vaginal bleeding. Negative for dysuria.  All other systems reviewed and are negative.    Physical Exam Updated Vital Signs BP 118/77   Pulse 88   Temp 98.4 F (36.9 C) (Oral)   Resp 17   LMP 09/10/2017   SpO2 100%   Physical Exam  Constitutional: She is oriented to person, place, and time. She appears well-developed and well-nourished. No distress.  HENT:  Head: Normocephalic and atraumatic.  Cardiovascular: Normal rate, regular rhythm and normal heart sounds.  Pulmonary/Chest: Effort normal. No respiratory distress. She has no wheezes.  Abdominal: Soft. Bowel sounds are normal. There is no tenderness.  Genitourinary:  Genitourinary Comments: External vaginal exam normal, dark blood pooled in the vaginal vault, no significant active bleeding, cervical os closed  Neurological: She is alert and oriented to person, place, and time.  Skin: Skin is warm and dry.  Psychiatric: She has a normal mood and affect.  Nursing note and vitals reviewed.    ED Treatments / Results  Labs (all labs ordered are listed, but only abnormal results are displayed) Labs Reviewed  HCG, QUANTITATIVE, PREGNANCY - Abnormal; Notable for the following components:      Result Value   hCG, Beta Chain, Quant, [place] [id number] (*)    All other components within normal limits  BASIC METABOLIC PANEL - Abnormal; Notable for the following components:   Sodium 134 (*)    All other components within normal limits  URINALYSIS, ROUTINE W REFLEX MICROSCOPIC - Abnormal; Notable for the following components:   Hgb urine dipstick LARGE (*)    Ketones, ur 5 (*)    Bacteria, UA  RARE (*)    Squamous Epithelial / LPF 0-5 (*)    All other components within normal limits  CBC  ABO/RH    EKG  EKG Interpretation None       Radiology [place] Ob Comp Less 14 Wks  Result Date: 12/13/2017 CLINICAL DATA:  Acute onset of vaginal bleeding. EXAM: OBSTETRIC <14 WK ULTRASOUND TECHNIQUE: Transabdominal ultrasound was performed for evaluation of the gestation as well as the maternal uterus and adnexal regions. COMPARISON:  Pelvic ultrasound performed 03/03/2015 FINDINGS: Intrauterine gestational sac: Single; visualized and normal in shape. Yolk sac:  No Embryo:  Yes Cardiac Activity: Yes Heart Rate: 157 bpm CRL:   5.87 cm   12 w 3 d                  US EDC: 06/24/2018 Subchorionic hemorrhage: A small amount of subchorionic hemorrhage is noted. Maternal uterus/adnexae: The uterus is otherwise unremarkable The ovaries are within normal limits. The right ovary measures 3.4 x 2.3 x 3.1 cm, while the left ovary measures 3.1 x 1.8 x 1.7 cm. No suspicious adnexal masses are seen; there is no evidence for ovarian torsion. No free fluid is seen within the pelvic cul-de-sac. IMPRESSION: 1. Single live intrauterine pregnancy noted, with a crown-rump length of 5.9 cm, corresponding to a gestational age of [redacted] weeks 3 days. This matches the gestational age of [redacted] weeks 2 days by LMP, reflecting an estimated date of delivery of June 25, 2018. 2. Small amount of subchorionic hemorrhage noted. Electronically Signed   By: Roanna RaiderJeffery  Chang M.D.   On: 12/13/2017 01:00    Procedures Procedures (including critical care time)  Medications Ordered in ED Medications - No data to display   Initial Impression / Assessment and Plan / ED Course  I have reviewed the triage vital signs and the nursing notes.  Pertinent labs & imaging results that were available during my care of the patient were reviewed by me and considered in my medical decision making (see chart for details).     Patient presents with  bleeding in first trimester.  She is overall nontoxic appearing.  Vital signs are reassuring.  Pelvic exam with dark blood.  Os is closed.  Ultrasound shows single live intrauterine pregnancy at appropriate gestation.  She does have a small subchorionic hemorrhage.  I discussed this with the patient.  She was given precautions regarding a threatened miscarriage.  She was given bleeding precautions.  No indication for RhoGam.  Will up with OB/GYN.  After history, exam, and medical workup I feel the patient has been appropriately medically screened and is safe for discharge home. Pertinent diagnoses were discussed with the patient. Patient was given return precautions.   Final Clinical Impressions(s) / ED Diagnoses   Final diagnoses:  Threatened miscarriage  Subchorionic hemorrhage of placenta in first trimester, single or unspecified fetus  ED Discharge Orders    None       Camya Haydon, Mayer Maskerourtney F, MD 12/13/17 [id number]

## 2017-12-13 NOTE — Discharge Instructions (Signed)
You were seen today for bleeding during her first trimester.  Her ultrasound is reassuring.  You do have a subchorionic hematoma.  This is likely the cause of your bleeding.  Follow-up closely with your OB/GYN.  If you develop worsening or recurrent bleeding, passage of clots, bleeding through more than 1 pad per hour you should be reevaluated.

## 2017-12-22 NOTE — L&D Delivery Note (Signed)
Pt progressed along a nl labor curve. She pushed briefly and had a SVD of one viable white female infant over an intact perineum. Placenta-S/I . [facility]

## 2018-05-30 LAB — OB RESULTS CONSOLE GBS: GBS: NEGATIVE

## 2018-06-21 ENCOUNTER — Other Ambulatory Visit: Payer: Self-pay

## 2018-06-21 ENCOUNTER — Encounter (HOSPITAL_COMMUNITY): Payer: Self-pay

## 2018-06-21 ENCOUNTER — Inpatient Hospital Stay (HOSPITAL_COMMUNITY)
Admission: AD | Admit: 2018-06-21 | Discharge: 2018-06-22 | DRG: 807 | Disposition: A | Discharge status: Home / Self Care | Payer: Medicaid Other | Attending: Obstetrics and Gynecology | Admitting: Obstetrics and Gynecology

## 2018-06-21 ENCOUNTER — Inpatient Hospital Stay (HOSPITAL_COMMUNITY): Payer: Medicaid Other | Admitting: Anesthesiology

## 2018-06-21 DIAGNOSIS — Z3A39 39 weeks gestation of pregnancy: Secondary | ICD-10-CM

## 2018-06-21 DIAGNOSIS — Z3483 Encounter for supervision of other normal pregnancy, third trimester: Secondary | ICD-10-CM | POA: Diagnosis present

## 2018-06-21 DIAGNOSIS — Z3490 Encounter for supervision of normal pregnancy, unspecified, unspecified trimester: Secondary | ICD-10-CM

## 2018-06-21 LAB — CBC
HCT: 37.8 % (ref 36.0–46.0)
Hemoglobin: 12.4 g/dL (ref 12.0–15.0)
MCH: 27.3 pg (ref 26.0–34.0)
MCHC: 32.8 g/dL (ref 30.0–36.0)
MCV: 83.3 fL (ref 78.0–100.0)
PLATELETS: 268 10*3/uL (ref 150–400)
RBC: 4.54 MIL/uL (ref 3.87–5.11)
RDW: 14.5 % (ref 11.5–15.5)
WBC: 9 10*3/uL (ref 4.0–10.5)

## 2018-06-21 LAB — TYPE AND SCREEN
ABO/RH(D): O POS
ANTIBODY SCREEN: NEGATIVE

## 2018-06-21 LAB — RPR: RPR Ser Ql: NONREACTIVE

## 2018-06-21 MED ORDER — LIDOCAINE HCL (PF) 1 % IJ SOLN
INTRAMUSCULAR | Status: DC | PRN
  Administered 2018-06-21: 6 mL via EPIDURAL

## 2018-06-21 MED ORDER — ACETAMINOPHEN 325 MG PO TABS
650.0000 mg | ORAL_TABLET | ORAL | Status: DC | PRN
  Filled 2018-06-21: qty 2

## 2018-06-21 MED ORDER — LIDOCAINE HCL (PF) 1 % IJ SOLN
INTRAMUSCULAR | Status: AC
  Filled 2018-06-21: qty 30

## 2018-06-21 MED ORDER — SIMETHICONE 80 MG PO CHEW: 80.0000 mg | CHEWABLE_TABLET | ORAL | Status: DC | PRN

## 2018-06-21 MED ORDER — ONDANSETRON HCL 4 MG/2ML IJ SOLN: 4.0000 mg | Freq: Four times a day (QID) | INTRAMUSCULAR | Status: DC | PRN

## 2018-06-21 MED ORDER — PHENYLEPHRINE 40 MCG/ML (10ML) SYRINGE FOR IV PUSH (FOR BLOOD PRESSURE SUPPORT): 80.0000 ug | PREFILLED_SYRINGE | INTRAVENOUS | Status: DC | PRN

## 2018-06-21 MED ORDER — DIPHENHYDRAMINE HCL 50 MG/ML IJ SOLN: 12.5000 mg | INTRAMUSCULAR | Status: DC | PRN

## 2018-06-21 MED ORDER — FLEET ENEMA 7-19 GM/118ML RE ENEM: 1.0000 | ENEMA | Freq: Every day | RECTAL | Status: DC | PRN

## 2018-06-21 MED ORDER — LACTATED RINGERS IV SOLN
500.0000 mL | Freq: Once | INTRAVENOUS | Status: AC
  Administered 2018-06-21: 500 mL via INTRAVENOUS

## 2018-06-21 MED ORDER — LACTATED RINGERS IV SOLN: 500.0000 mL | Freq: Once | INTRAVENOUS | Status: DC

## 2018-06-21 MED ORDER — TETANUS-DIPHTH-ACELL PERTUSSIS 5-2.5-18.5 LF-MCG/0.5 IM SUSP: 0.5000 mL | Freq: Once | INTRAMUSCULAR | Status: DC

## 2018-06-21 MED ORDER — ZOLPIDEM TARTRATE 5 MG PO TABS: 5.0000 mg | ORAL_TABLET | Freq: Every evening | ORAL | Status: DC | PRN

## 2018-06-21 MED ORDER — SENNOSIDES-DOCUSATE SODIUM 8.6-50 MG PO TABS
2.0000 | ORAL_TABLET | ORAL | Status: DC
  Administered 2018-06-21: 2 via ORAL
  Filled 2018-06-21: qty 2

## 2018-06-21 MED ORDER — OXYCODONE-ACETAMINOPHEN 5-325 MG PO TABS
1.0000 | ORAL_TABLET | ORAL | Status: DC | PRN
  Administered 2018-06-21 – 2018-06-22 (×3): 1 via ORAL
  Filled 2018-06-21 (×3): qty 1

## 2018-06-21 MED ORDER — OXYTOCIN BOLUS FROM INFUSION
500.0000 mL | Freq: Once | INTRAVENOUS | Status: AC
  Administered 2018-06-21: 500 mL via INTRAVENOUS

## 2018-06-21 MED ORDER — COCONUT OIL OIL: 1.0000 "application " | TOPICAL_OIL | Status: DC | PRN

## 2018-06-21 MED ORDER — PHENYLEPHRINE 40 MCG/ML (10ML) SYRINGE FOR IV PUSH (FOR BLOOD PRESSURE SUPPORT)
PREFILLED_SYRINGE | INTRAVENOUS | Status: AC
  Filled 2018-06-21: qty 10

## 2018-06-21 MED ORDER — EPHEDRINE 5 MG/ML INJ
10.0000 mg | INTRAVENOUS | Status: DC | PRN
  Filled 2018-06-21: qty 2

## 2018-06-21 MED ORDER — EPHEDRINE 5 MG/ML INJ: 10.0000 mg | INTRAVENOUS | Status: DC | PRN

## 2018-06-21 MED ORDER — OXYCODONE-ACETAMINOPHEN 5-325 MG PO TABS: 2.0000 | ORAL_TABLET | ORAL | Status: DC | PRN

## 2018-06-21 MED ORDER — PHENYLEPHRINE 40 MCG/ML (10ML) SYRINGE FOR IV PUSH (FOR BLOOD PRESSURE SUPPORT)
80.0000 ug | PREFILLED_SYRINGE | INTRAVENOUS | Status: DC | PRN
  Filled 2018-06-21: qty 5

## 2018-06-21 MED ORDER — LACTATED RINGERS IV SOLN
INTRAVENOUS | Status: DC
  Administered 2018-06-21: 09:00:00 via INTRAVENOUS

## 2018-06-21 MED ORDER — OXYTOCIN 40 UNITS IN LACTATED RINGERS INFUSION - SIMPLE MED: 2.5000 [IU]/h | INTRAVENOUS | Status: DC

## 2018-06-21 MED ORDER — IBUPROFEN 600 MG PO TABS
600.0000 mg | ORAL_TABLET | Freq: Four times a day (QID) | ORAL | Status: DC
  Administered 2018-06-21 – 2018-06-22 (×4): 600 mg via ORAL
  Filled 2018-06-21 (×3): qty 1

## 2018-06-21 MED ORDER — FENTANYL 2.5 MCG/ML BUPIVACAINE 1/10 % EPIDURAL INFUSION (WH - ANES)
14.0000 mL/h | INTRAMUSCULAR | Status: DC | PRN
  Administered 2018-06-21: 14 mL/h via EPIDURAL
  Filled 2018-06-21 (×2): qty 100

## 2018-06-21 MED ORDER — EPHEDRINE 5 MG/ML INJ
10.0000 mg | INTRAVENOUS | Status: DC | PRN
Start: 2018-06-21 — End: 2018-06-22
  Filled 2018-06-21: qty 2

## 2018-06-21 MED ORDER — ONDANSETRON HCL 4 MG/2ML IJ SOLN: 4.0000 mg | INTRAMUSCULAR | Status: DC | PRN

## 2018-06-21 MED ORDER — SOD CITRATE-CITRIC ACID 500-334 MG/5ML PO SOLN: 30.0000 mL | ORAL | Status: DC | PRN

## 2018-06-21 MED ORDER — FENTANYL CITRATE (PF) 100 MCG/2ML IJ SOLN: 100.0000 ug | Freq: Once | INTRAMUSCULAR | Status: DC

## 2018-06-21 MED ORDER — OXYTOCIN 40 UNITS IN LACTATED RINGERS INFUSION - SIMPLE MED
INTRAVENOUS | Status: AC
  Filled 2018-06-21: qty 1000

## 2018-06-21 MED ORDER — FENTANYL 2.5 MCG/ML BUPIVACAINE 1/10 % EPIDURAL INFUSION (WH - ANES)
14.0000 mL/h | INTRAMUSCULAR | Status: DC | PRN
  Administered 2018-06-21: 14 mL/h via EPIDURAL
  Filled 2018-06-21 (×4): qty 100

## 2018-06-21 MED ORDER — LIDOCAINE HCL (PF) 1 % IJ SOLN
30.0000 mL | INTRAMUSCULAR | Status: DC | PRN
  Filled 2018-06-21: qty 30

## 2018-06-21 MED ORDER — FENTANYL CITRATE (PF) 100 MCG/2ML IJ SOLN
INTRAMUSCULAR | Status: AC
  Administered 2018-06-21: 100 ug
  Filled 2018-06-21: qty 2

## 2018-06-21 MED ORDER — ACETAMINOPHEN 325 MG PO TABS
650.0000 mg | ORAL_TABLET | ORAL | Status: DC | PRN
  Administered 2018-06-21: 650 mg via ORAL

## 2018-06-21 MED ORDER — ONDANSETRON HCL 4 MG PO TABS: 4.0000 mg | ORAL_TABLET | ORAL | Status: DC | PRN

## 2018-06-21 MED ORDER — BENZOCAINE-MENTHOL 20-0.5 % EX AERO
1.0000 "application " | INHALATION_SPRAY | CUTANEOUS | Status: DC | PRN
  Administered 2018-06-21: 1 via TOPICAL
  Filled 2018-06-21: qty 56

## 2018-06-21 MED ORDER — DIBUCAINE 1 % RE OINT: 1.0000 "application " | TOPICAL_OINTMENT | RECTAL | Status: DC | PRN

## 2018-06-21 MED ORDER — MEASLES, MUMPS & RUBELLA VAC ~~LOC~~ INJ
0.5000 mL | INJECTION | Freq: Once | SUBCUTANEOUS | Status: DC
  Filled 2018-06-21: qty 0.5

## 2018-06-21 MED ORDER — WITCH HAZEL-GLYCERIN EX PADS: 1.0000 "application " | MEDICATED_PAD | CUTANEOUS | Status: DC | PRN

## 2018-06-21 MED ORDER — LACTATED RINGERS IV SOLN
500.0000 mL | INTRAVENOUS | Status: DC | PRN
  Administered 2018-06-21: 1000 mL via INTRAVENOUS

## 2018-06-21 NOTE — MAU Note (Signed)
PT began contracting around 0300. Denies LOF or bleeding. +FM.

## 2018-06-21 NOTE — H&P (Signed)
22 y.o. [redacted]w[redacted]d  G2P1001 comes in c/o ctx, possible ucus blood with bloody show..  Otherwise has good fetal movement and no bleeding.  Past Medical History:  Diagnosis Date  . Medical history non-contributory     Past Surgical History:  Procedure Laterality Date  . WISDOM TOOTH EXTRACTION      OB History  Gravida Para Term Preterm AB Living  2 1 1     1   SAB TAB Ectopic Multiple Live Births        0 1    # Outcome Date GA Lbr Len/2nd Weight Sex Delivery Anes PTL Lv  2 Current           1 Term 10/15/15 2949w5d 18:35 / 02:49 3.39 kg (7 lb 7.6 oz) M Vag-Spont EPI  LIV     Birth Comments: WNL    Social History   Socioeconomic History  . Marital status: Single    Spouse name: Not on file  . Number of children: Not on file  . Years of education: Not on file  . Highest education level: Not on file  Occupational History  . Not on file  Social Needs  . Financial resource strain: Not on file  . Food insecurity:    Worry: Not on file    Inability: Not on file  . Transportation needs:    Medical: Not on file    Non-medical: Not on file  Tobacco Use  . Smoking status: Never Smoker  Substance and Sexual Activity  . Alcohol use: No  . Drug use: No  . Sexual activity: Yes    Birth control/protection: Pill  Lifestyle  . Physical activity:    Days per week: Not on file    Minutes per session: Not on file  . Stress: Not on file  Relationships  . Social connections:    Talks on phone: Not on file    Gets together: Not on file    Attends religious service: Not on file    Active member of club or organization: Not on file    Attends meetings of clubs or organizations: Not on file    Relationship status: Not on file  . Intimate partner violence:    Fear of current or ex partner: Not on file    Emotionally abused: Not on file    Physically abused: Not on file    Forced sexual activity: Not on file  Other Topics Concern  . Not on file  Social History Narrative  . Not on file   Patient has no known allergies.    Prenatal Transfer Tool  Maternal Diabetes: No Genetic Screening: Normal Maternal Ultrasounds/Referrals: Normal Fetal Ultrasounds or other Referrals:  None Maternal Substance Abuse:  No Significant Maternal Medications:  None Significant Maternal Lab Results: Lab values include: Group B Strep negative  Other PNC: uncomplicated.    Vitals:   06/21/18 0441 06/21/18 0457 06/21/18 0528  BP: (!) 133/92 136/87   Pulse: 81    Resp: 18    Temp: 98.2 F (36.8 C)    TempSrc: Oral    SpO2: 98%    Weight:   98 kg (216 lb 1.3 oz)  Height: 5\' 7"  (1.702 m)      Lungs/Cor:  NAD Abdomen:  soft, gravid Ex:  no cords, erythema SVE:  4/60/-2 FHTs:  135, good STV, NST R Toco:  Not tracing well, but possibly q3   A/P   Admit in labor  GBS Neg  Pt desires  epidural  Other routine care  [place], [place]

## 2018-06-21 NOTE — Anesthesia Procedure Notes (Signed)
Epidural Patient location during procedure: OB Start time: 06/21/2018 6:02 AM End time: 06/21/2018 6:22 AM  Staffing Anesthesiologist: Trevor IhaHouser, Takeru Bose A, MD Performed: anesthesiologist   Preanesthetic Checklist Completed: patient identified, site marked, surgical consent, pre-op evaluation, timeout performed, IV checked, risks and benefits discussed and monitors and equipment checked  Epidural Patient position: sitting Prep: site prepped and draped and DuraPrep Patient monitoring: continuous pulse ox and blood pressure Approach: midline Location: L3-L4 Injection technique: LOR air  Needle:  Needle type: Tuohy  Needle gauge: 17 G Needle length: 9 cm and 9 Needle insertion depth: 6 cm Catheter type: closed end flexible Catheter size: 19 Gauge Catheter at skin depth: 12 cm Test dose: negative  Assessment Events: blood not aspirated, injection not painful, no injection resistance, negative IV test and no paresthesia

## 2018-06-21 NOTE — Anesthesia Pain Management Evaluation Note (Signed)
  CRNA Pain Management Visit Note  Patient: Whitney Martin, 22 y.o., female  "Hello I am a [facility]. We have an anesthesia team available at all times to provide care throughout the hospital, including epidural management and anesthesia for C-section. I don't know your plan for the delivery whether it a natural birth, water birth, IV sedation, nitrous supplementation, doula or epidural, but we want to meet your pain goals."   1.Was your pain managed to your expectations on prior hospitalizations?   Yes   2.What is your expectation for pain management during this hospitalization?     Epidural  3.How can we help you reach that goal? Epidural at pain goal.  Record the patient's initial score and the patient's pain goal.   Pain: 2  Pain Goal: 7 [facility] wants you to be able to say your pain was always managed very well.  Barabara Motz 06/21/2018

## 2018-06-21 NOTE — Anesthesia Preprocedure Evaluation (Addendum)
Anesthesia Evaluation  Patient identified by MRN, date of birth, ID band Patient awake    Reviewed: Allergy & Precautions, NPO status , Patient's Chart, lab work & pertinent test results  Airway Mallampati: II  TM Distance: >3 FB Neck ROM: Full    Dental no notable dental hx. (+) Teeth Intact   Pulmonary neg pulmonary ROS,    Pulmonary exam normal breath sounds clear to auscultation       Cardiovascular negative cardio ROS Normal cardiovascular exam Rhythm:Regular Rate:Normal     Neuro/Psych negative neurological ROS  negative psych ROS   GI/Hepatic   Endo/Other    Renal/GU      Musculoskeletal   Abdominal   Peds  Hematology   Anesthesia Other Findings   Reproductive/Obstetrics (+) Pregnancy                             Lab Results  Component Value Date   WBC 9.5 12/12/2017   HGB 13.2 12/12/2017   HCT 40.0 12/12/2017   MCV 89.1 12/12/2017   PLT 278 12/12/2017    Anesthesia Physical Anesthesia Plan  ASA: II  Anesthesia Plan: Epidural   Post-op Pain Management:    Induction:   PONV Risk Score and Plan:   Airway Management Planned:   Additional Equipment:   Intra-op Plan:   Post-operative Plan:   Informed Consent: I have reviewed the patients History and Physical, chart, labs and discussed the procedure including the risks, benefits and alternatives for the proposed anesthesia with the patient or authorized representative who has indicated his/her understanding and acceptance.     Plan Discussed with:   Anesthesia Plan Comments:         Anesthesia Quick Evaluation

## 2018-06-21 NOTE — Lactation Note (Signed)
This note was copied from a baby's chart. Lactation Consultation Note  Patient Name: Whitney Martin Reason for consult: Initial assessment;Term  P2 mother whose infant is now 5012 hours old.  Mother has a 412 1/24 year old who she breastfed for 9 months.  Mother is having no problems with breastfeeding at this time.  Her breasts are soft and non tender and she states baby has been feeding well so far.  She is not having pain during breastfeeding and feels like baby is latched well.  I encouraged her to have an RN/LC to observe her latch every shift.  Baby  is currently being assessed by the RN.    Encouraged mother to feed 8-12 times/24 hours or earlier if he shows feeding cues.  She is familiar with hand expression and I encouraged it after feedings to help increase milk supply.  Mom made aware of O/P services, breastfeeding support groups, community resources, and our phone # for post-discharge questions. Mother will call for assistance as needed.   Maternal Data Formula Feeding for Exclusion: No Has patient been taught Hand Expression?: Yes Does the patient have breastfeeding experience prior to this delivery?: Yes  Feeding    LATCH Score                   Interventions    Lactation Tools Discussed/Used WIC Program: No   Consult Status Consult Status: Follow-up Date: 06/22/18 Follow-up type: In-patient    Whitney Martin R Elbony Mcclimans Martin, 11:28 PM

## 2018-06-21 NOTE — Anesthesia Postprocedure Evaluation (Signed)
Anesthesia Post Note  Patient: Whitney Martin  Procedure(s) Performed: AN AD HOC LABOR EPIDURAL     Patient location during evaluation: Mother Baby Anesthesia Type: Epidural Level of consciousness: awake Pain management: satisfactory to patient Vital Signs Assessment: post-procedure vital signs reviewed and stable Respiratory status: spontaneous breathing Cardiovascular status: stable Anesthetic complications: no    Last Vitals:  Vitals:   06/21/18 1131 06/21/18 1146  BP: 127/80 134/80  Pulse: 75 73  Resp:    Temp:    SpO2:      Last Pain:  Vitals:   06/21/18 0849  TempSrc:   PainSc: 0-No pain   Pain Goal:                 Cephus ShellingBURGER,Cabe Lashley

## 2018-06-22 ENCOUNTER — Encounter (HOSPITAL_COMMUNITY): Payer: Self-pay | Admitting: *Deleted

## 2018-06-22 LAB — CBC
HCT: 34.7 % — ABNORMAL LOW (ref 36.0–46.0)
HEMOGLOBIN: 11.4 g/dL — AB (ref 12.0–15.0)
MCH: 27.8 pg (ref 26.0–34.0)
MCHC: 32.9 g/dL (ref 30.0–36.0)
MCV: 84.6 fL (ref 78.0–100.0)
Platelets: 229 10*3/uL (ref 150–400)
RBC: 4.1 MIL/uL (ref 3.87–5.11)
RDW: 14.8 % (ref 11.5–15.5)
WBC: 11.5 10*3/uL — ABNORMAL HIGH (ref 4.0–10.5)

## 2018-06-22 MED ORDER — IBUPROFEN 600 MG PO TABS: 600.0000 mg | ORAL_TABLET | Freq: Four times a day (QID) | ORAL | 0 refills | Status: DC | PRN

## 2018-06-22 MED ORDER — OXYCODONE-ACETAMINOPHEN 5-325 MG PO TABS: 1.0000 | ORAL_TABLET | Freq: Four times a day (QID) | ORAL | 0 refills | Status: DC | PRN

## 2018-06-22 NOTE — Discharge Summary (Signed)
Obstetric Discharge Summary Reason for Admission: onset of labor Prenatal Procedures: ultrasound Intrapartum Procedures: spontaneous vaginal delivery Postpartum Procedures: none Complications-Operative and Postpartum: none Hemoglobin  Date Value Ref Range Status  06/22/2018 11.4 (L) 12.0 - 15.0 g/dL Final   HCT  Date Value Ref Range Status  06/22/2018 34.7 (L) 36.0 - 46.0 % Final    Physical Exam:  General: alert, cooperative and appears stated age 53Lochia: appropriate Uterine Fundus: firm   Discharge Diagnoses: Term Pregnancy-delivered  Discharge Information: Date: 06/22/2018 Activity: pelvic rest Diet: routine Medications: Ibuprofen and Percocet Condition: improved Instructions: refer to practice specific booklet Discharge to: home Follow-up Information    Whitney Martin, Whitney E, MD Follow up in 4 week(s).   Specialty:  Obstetrics and Gynecology Why:  For a postpartum evaluation Contact information: [address] [place] [phone]           Newborn Data: Live born female  Birth Weight: 7 lb 8.5 oz (3415 g) APGAR: 8, 9  Newborn Delivery   Birth date/time:  06/21/2018 10:49:00 Delivery type:  Vaginal, Spontaneous     Home with mother.  Whitney Martin 06/22/2018, 10:09 AM

## 2018-06-22 NOTE — Lactation Note (Signed)
This note was copied from a baby's chart. Lactation Consultation Note  Patient Name: Whitney Martin Reason for consult: Follow-up assessment Baby is 22 hours old and will be discharged later today.  Mom reports feedings are going well.  Discussed milk coming to volume and engorgement prevention and treatment.  Lactation outpatient services and support information reviewed and encouraged.  Maternal Data    Feeding Feeding Type: Breast Fed Length of feed: 0 min  LATCH Score                   Interventions    Lactation Tools Discussed/Used     Consult Status Consult Status: Complete Follow-up type: Call as needed    Huston FoleyMOULDEN, Tadhg Eskew [name], 9:31 AM

## 2019-10-11 ENCOUNTER — Other Ambulatory Visit: Payer: Self-pay | Admitting: Family Medicine

## 2019-10-11 DIAGNOSIS — R1030 Lower abdominal pain, unspecified: Secondary | ICD-10-CM

## 2019-10-17 ENCOUNTER — Ambulatory Visit
Admission: RE | Admit: 2019-10-17 | Discharge: 2019-10-17 | Disposition: A | Discharge status: Home / Self Care | Payer: 59 | Source: Ambulatory Visit | Attending: Family Medicine | Admitting: Family Medicine

## 2019-10-17 DIAGNOSIS — R1030 Lower abdominal pain, unspecified: Secondary | ICD-10-CM

## 2019-10-17 MED ORDER — IOPAMIDOL (ISOVUE-300) INJECTION 61%
100.0000 mL | Freq: Once | INTRAVENOUS | Status: AC | PRN
  Administered 2019-10-17: 100 mL via INTRAVENOUS

## 2020-02-26 ENCOUNTER — Ambulatory Visit: Payer: 59 | Attending: Internal Medicine

## 2020-02-26 DIAGNOSIS — Z23 Encounter for immunization: Secondary | ICD-10-CM | POA: Insufficient documentation

## 2020-02-26 NOTE — Progress Notes (Signed)
   Covid-19 Vaccination Clinic  Name:  ATHEA HALEY    MRN: [id number] DOB: 07-21-1996  02/26/2020  Ms. Licklider was observed post Covid-19 immunization for 15 minutes without incident. She was provided with Vaccine Information Sheet and instruction to access the V-Safe system.   Ms. Franson was instructed to call 911 with any severe reactions post vaccine: Marland Kitchen Difficulty breathing  . Swelling of face and throat  . A fast heartbeat  . A bad rash all over body  . Dizziness and weakness   Immunizations Administered    Name Date Dose VIS Date Route   Pfizer COVID-19 Vaccine 02/26/2020  3:59 PM 0.3 mL 12/02/2019 Intramuscular   Manufacturer: [employer], [employer]   Lot: [id number]   NDC: [address]-3

## 2020-03-27 ENCOUNTER — Ambulatory Visit: Payer: 59 | Attending: Internal Medicine

## 2020-03-27 DIAGNOSIS — Z23 Encounter for immunization: Secondary | ICD-10-CM

## 2020-03-27 NOTE — Progress Notes (Signed)
   Covid-19 Vaccination Clinic  Name:  HANNELORE BOVA    MRN: [id number] DOB: June 29, 1996  03/27/2020  Ms. Mis was observed post Covid-19 immunization for 15 minutes without incident. She was provided with Vaccine Information Sheet and instruction to access the V-Safe system.   Ms. Klute was instructed to call 911 with any severe reactions post vaccine: Marland Kitchen Difficulty breathing  . Swelling of face and throat  . A fast heartbeat  . A bad rash all over body  . Dizziness and weakness   Immunizations Administered    Name Date Dose VIS Date Route   Pfizer COVID-19 Vaccine 03/27/2020  2:30 PM 0.3 mL 12/02/2019 Intramuscular   Manufacturer: [employer], [employer]   Lot: [id number]   NDC: [address]-0

## 2020-08-15 ENCOUNTER — Ambulatory Visit (HOSPITAL_COMMUNITY)
Admission: EM | Admit: 2020-08-15 | Discharge: 2020-08-15 | Disposition: A | Discharge status: Home / Self Care | Payer: 59 | Attending: Emergency Medicine | Admitting: Emergency Medicine

## 2020-08-15 ENCOUNTER — Encounter (HOSPITAL_COMMUNITY): Payer: Self-pay

## 2020-08-15 ENCOUNTER — Other Ambulatory Visit: Payer: Self-pay

## 2020-08-15 DIAGNOSIS — B349 Viral infection, unspecified: Secondary | ICD-10-CM | POA: Insufficient documentation

## 2020-08-15 DIAGNOSIS — Z20822 Contact with and (suspected) exposure to covid-19: Secondary | ICD-10-CM | POA: Insufficient documentation

## 2020-08-15 DIAGNOSIS — T63621A Toxic effect of contact with other jellyfish, accidental (unintentional), initial encounter: Secondary | ICD-10-CM | POA: Insufficient documentation

## 2020-08-15 LAB — POCT RAPID STREP A, ED / UC: Streptococcus, Group A Screen (Direct): NEGATIVE

## 2020-08-15 LAB — POC URINE PREG, ED: Preg Test, Ur: NEGATIVE

## 2020-08-15 LAB — POCT URINALYSIS DIPSTICK, ED / UC
Bilirubin Urine: NEGATIVE
Glucose, UA: NEGATIVE mg/dL
Ketones, ur: NEGATIVE mg/dL
Leukocytes,Ua: NEGATIVE
Nitrite: NEGATIVE
Protein, ur: NEGATIVE mg/dL
Specific Gravity, Urine: 1.025 (ref 1.005–1.030)
Urobilinogen, UA: 0.2 mg/dL (ref 0.0–1.0)
pH: 5.5 (ref 5.0–8.0)

## 2020-08-15 MED ORDER — TRIAMCINOLONE ACETONIDE 0.1 % EX CREA: 1.0000 "application " | TOPICAL_CREAM | Freq: Two times a day (BID) | CUTANEOUS | 0 refills | Status: DC

## 2020-08-15 MED ORDER — IBUPROFEN 600 MG PO TABS: 600.0000 mg | ORAL_TABLET | Freq: Four times a day (QID) | ORAL | 0 refills | Status: DC | PRN

## 2020-08-15 MED ORDER — BENZONATATE 200 MG PO CAPS: 200.0000 mg | ORAL_CAPSULE | Freq: Three times a day (TID) | ORAL | 0 refills | Status: DC | PRN

## 2020-08-15 MED ORDER — FLUTICASONE PROPIONATE 50 MCG/ACT NA SUSP: 2.0000 | Freq: Every day | NASAL | 0 refills | Status: DC

## 2020-08-15 MED ORDER — HYDROXYZINE HCL 25 MG PO TABS: 25.0000 mg | ORAL_TABLET | Freq: Four times a day (QID) | ORAL | 0 refills | Status: DC | PRN

## 2020-08-15 NOTE — ED Triage Notes (Signed)
Pt is here with nausea and body aches with mild fatigue after being stung by a jellyfish on both of [place] the [place] on [place], pt has taken Benedryl, Advil to relieve discomfort.

## 2020-08-15 NOTE — Discharge Instructions (Addendum)
Your Covid test will be back in 6 to 24 hours.  We will contact you if it comes back positive.  Your strep, urine test and urine pregnancy tests are all negative.  Jellyfish sting - try Claritin or Zyrtec, if this does not work, Atarax.  Triamcinolone to the itchy areas.

## 2020-08-15 NOTE — ED Provider Notes (Signed)
HPI  SUBJECTIVE:  Whitney Martin is a 24 y.o. female who presents with bilateral lower extremity itching after being stung by jellyfish 5 days ago while [place] the beach.  She denies pain, crusting.  She also states she feels as if "I have Covid".  She reports body aches, sore throat, nasal congestion, headache, fatigue, cough, sinus pressure, nausea.  No rhinorrhea, facial swelling, upper dental pain.  No fevers, loss of sense of smell or taste, shortness of breath, vomiting, diarrhea, abdominal pain.  She denies Covid exposure.  She got both doses of the Covid vaccine more than 2 weeks ago.  No antibiotics in the past month.  No antipyretic in the past 6 hours.  She tried Benadryl, 800 mg ibuprofen daily and Excedrin with some improvement in her symptoms.  Symptoms are worse with the smell of meat.  She has a past medical history of migraines.  No history of asthma, coronary disease, diabetes, hypertension, chronic kidney disease, HIV, cancer, immunocompromise.  LMP: 8/4.  She would like to be checked for pregnancy.  EVO:[id number], [facility]  Past Medical History:  Diagnosis Date  . Medical history non-contributory     Past Surgical History:  Procedure Laterality Date  . WISDOM TOOTH EXTRACTION      Family History  Problem Relation Age of Onset  . Healthy Mother   . Healthy Father     Social History   Tobacco Use  . Smoking status: Never Smoker  . Smokeless tobacco: Never Used  Substance Use Topics  . Alcohol use: Yes  . Drug use: No    No current facility-administered medications for this encounter.  Current Outpatient Medications:  .  benzonatate (TESSALON) 200 MG capsule, Take 1 capsule (200 mg total) by mouth 3 (three) times daily as needed for cough., Disp: 30 capsule, Rfl: 0 .  fluticasone (FLONASE) 50 MCG/ACT nasal spray, Place 2 sprays into both nostrils daily., Disp: 16 g, Rfl: 0 .  hydrOXYzine (ATARAX/VISTARIL) 25 MG tablet, Take 1 tablet (25 mg total) by  mouth every 6 (six) hours as needed for itching., Disp: 20 tablet, Rfl: 0 .  ibuprofen (ADVIL) 600 MG tablet, Take 1 tablet (600 mg total) by mouth every 6 (six) hours as needed., Disp: 30 tablet, Rfl: 0 .  triamcinolone cream (KENALOG) 0.1 %, Apply 1 application topically 2 (two) times daily. Apply for 2 weeks. May use on face, Disp: 30 g, Rfl: 0  No Known Allergies   ROS  As noted in HPI.   Physical Exam  BP 104/82 (BP Location: Right Arm)   Pulse 83   Temp 98.5 F (36.9 C) (Oral)   Resp 18   LMP 07/25/2020   SpO2 100%   Breastfeeding No   Constitutional: Well developed, well nourished, no acute distress Eyes:  EOMI, conjunctiva normal bilaterally HENT: Normocephalic, atraumatic,mucus membranes moist. Erythematous, swollen turbinates. Clear nasal congestion. No maxillary, frontal sinus tenderness. Normal tonsils without exudates, uvula midline. Positive postnasal drip. Positive cervical lymphadenopathy Respiratory: Normal inspiratory effort, lungs clear bilaterally Cardiovascular: Normal rate regular rhythm no murmurs rubs or gallops  GI: nondistended soft, nontender, no splenomegaly skin: No rash, skin intact Musculoskeletal: no deformities Neurologic: Alert & oriented x 3, no focal neuro deficits Psychiatric: Speech and behavior appropriate   ED Course   Medications - No data to display  Orders Placed This Encounter  Procedures  . SARS CORONAVIRUS 2 (TAT 6-24 HRS) Nasopharyngeal Nasopharyngeal Swab    Standing Status:   Standing  Number of Occurrences:   1    Order Specific Question:   Is this test for diagnosis or screening    Answer:   Diagnosis of ill patient    Order Specific Question:   Symptomatic for COVID-19 as defined by CDC    Answer:   Yes    Order Specific Question:   Date of Symptom Onset    Answer:   08/11/2020    Order Specific Question:   Hospitalized for COVID-19    Answer:   No    Order Specific Question:   Admitted to ICU for COVID-19     Answer:   No    Order Specific Question:   Previously tested for COVID-19    Answer:   No    Order Specific Question:   Resident in a congregate (group) care setting    Answer:   No    Order Specific Question:   Employed in healthcare setting    Answer:   No    Order Specific Question:   Pregnant    Answer:   Unknown    Order Specific Question:   Has patient completed COVID vaccination(s) (2 doses of Pfizer/Moderna 1 dose of [employer])    Answer:   Yes  . Culture, group A strep (throat)    Standing Status:   Standing    Number of Occurrences:   1  . POC urine pregnancy    Standing Status:   Standing    Number of Occurrences:   1  . POC Urinalysis dipstick    Standing Status:   Standing    Number of Occurrences:   1  . POCT Rapid Strep A    Standing Status:   Standing    Number of Occurrences:   1    Results for orders placed or performed during the hospital encounter of 08/15/20 (from the past 24 hour(s))  POC Urinalysis dipstick     Status: Abnormal   Collection Time: 08/15/20  8:58 PM  Result Value Ref Range   Glucose, UA NEGATIVE NEGATIVE mg/dL   Bilirubin Urine NEGATIVE NEGATIVE   Ketones, ur NEGATIVE NEGATIVE mg/dL   Specific Gravity, Urine 1.025 1.005 - 1.030   Hgb urine dipstick TRACE (A) NEGATIVE   pH 5.5 5.0 - 8.0   Protein, ur NEGATIVE NEGATIVE mg/dL   Urobilinogen, UA 0.2 0.0 - 1.0 mg/dL   Nitrite NEGATIVE NEGATIVE   Leukocytes,Ua NEGATIVE NEGATIVE  POC urine pregnancy     Status: None   Collection Time: 08/15/20  9:05 PM  Result Value Ref Range   Preg Test, Ur NEGATIVE NEGATIVE  POCT Rapid Strep A     Status: None   Collection Time: 08/15/20  9:11 PM  Result Value Ref Range   Streptococcus, Group A Screen (Direct) NEGATIVE NEGATIVE   No results found.  ED Clinical Impression  1. Viral illness   2. Jellyfish sting, accidental or unintentional, initial encounter   3. Encounter for laboratory testing for COVID-19 virus      ED  Assessment/Plan  One. Jellyfish sting. No evidence of infection. Will have her try Claritin or Zyrtec, and if this does not work, then Atarax. Triamcinolone cream as well.  Two. Viral infection. Covid, strep done.  Strep negative.  Supportive treatment with ibuprofen/Tylenol, saline nasal rogation, Flonase, Mucinex D. Work note for 2 days. Follow-up with PMD as needed.  Pregnancy test negative.  No UTI.  Discussed labs,  MDM, treatment plan, and plan for  follow-up with patient. patient agrees with plan.   Meds ordered this encounter  Medications  . hydrOXYzine (ATARAX/VISTARIL) 25 MG tablet    Sig: Take 1 tablet (25 mg total) by mouth every 6 (six) hours as needed for itching.    Dispense:  20 tablet    Refill:  0  . triamcinolone cream (KENALOG) 0.1 %    Sig: Apply 1 application topically 2 (two) times daily. Apply for 2 weeks. May use on face    Dispense:  30 g    Refill:  0  . benzonatate (TESSALON) 200 MG capsule    Sig: Take 1 capsule (200 mg total) by mouth 3 (three) times daily as needed for cough.    Dispense:  30 capsule    Refill:  0  . fluticasone (FLONASE) 50 MCG/ACT nasal spray    Sig: Place 2 sprays into both nostrils daily.    Dispense:  16 g    Refill:  0  . ibuprofen (ADVIL) 600 MG tablet    Sig: Take 1 tablet (600 mg total) by mouth every 6 (six) hours as needed.    Dispense:  30 tablet    Refill:  0    *This clinic note was created using Scientist, clinical (histocompatibility and immunogenetics). Therefore, there may be occasional mistakes despite careful proofreading.   ?    Domenick Gong, MD 08/17/20 [phone]

## 2020-08-16 LAB — SARS CORONAVIRUS 2 (TAT 6-24 HRS): SARS Coronavirus 2: NEGATIVE

## 2020-08-18 LAB — CULTURE, GROUP A STREP (THRC)

## 2021-07-24 ENCOUNTER — Encounter: Payer: Self-pay | Admitting: *Deleted

## 2021-07-30 ENCOUNTER — Encounter: Payer: Self-pay | Admitting: Diagnostic Neuroimaging

## 2021-07-30 ENCOUNTER — Ambulatory Visit (INDEPENDENT_AMBULATORY_CARE_PROVIDER_SITE_OTHER): Payer: 59 | Admitting: Diagnostic Neuroimaging

## 2021-07-30 VITALS — BP 105/71 | HR 76 | Ht 67.0 in | Wt 194.2 lb

## 2021-07-30 DIAGNOSIS — G43101 Migraine with aura, not intractable, with status migrainosus: Secondary | ICD-10-CM

## 2021-07-30 MED ORDER — RIZATRIPTAN BENZOATE 10 MG PO TBDP: 10.0000 mg | ORAL_TABLET | ORAL | 11 refills | Status: DC | PRN

## 2021-07-30 MED ORDER — TOPIRAMATE 50 MG PO TABS: 50.0000 mg | ORAL_TABLET | Freq: Two times a day (BID) | ORAL | 12 refills | Status: DC

## 2021-07-30 NOTE — Progress Notes (Signed)
[facility]  PATIENT: Whitney Martin DOB: 1996/11/28  REFERRING CLINICIAN: [facility] [place] * HISTORY FROM: patient  REASON FOR VISIT: new consult    HISTORICAL  CHIEF COMPLAINT:  Chief Complaint  Patient presents with   Headache    Rm 7 New Pt  "averaging 3-4 headaches a week, ubrelvy samples have helped with caffeine, Excedrin only helps occasionally, sumatriptan did not help;  have received toradol/depo-medrol injection x 1 which helped"    HISTORY OF PRESENT ILLNESS:   25 year old female here for evaluation of headaches.  Patient has had headaches since age 45 years old with global severe headaches, phonophobia, neck and shoulder pain.  Headaches can last hours or days at a time.  Initially was having 2 headaches per week but now having up to 3 to 4/week.  Sometimes has nausea and sensitive to light and sound.  Sometimes has spots and sparkles in her visual fields.  Sometimes with sweating and blurred vision.  Family history of migraine in her mother.  Triggers include decreased sleep, stress and weather change.   REVIEW OF SYSTEMS: Full 14 system review of systems performed and negative with exception of: As per HPI.  ALLERGIES: No Known Allergies  HOME MEDICATIONS: Outpatient Medications Prior to Visit  Medication Sig Dispense Refill   aspirin-acetaminophen-caffeine (EXCEDRIN MIGRAINE) 250-250-65 MG tablet Take by mouth every 6 (six) hours as needed for headache.     cyclobenzaprine (FLEXERIL) 5 MG tablet Take 5 mg by mouth daily.     naproxen (NAPROSYN) 500 MG tablet naproxen 500 mg tablet     PORTIA-28 0.15-30 MG-MCG tablet Take by mouth daily.     sertraline (ZOLOFT) 100 MG tablet Take 100 mg by mouth daily.     benzonatate (TESSALON) 200 MG capsule Take 1 capsule (200 mg total) by mouth 3 (three) times daily as needed for cough. 30 capsule 0   fluticasone (FLONASE) 50 MCG/ACT nasal spray Place 2 sprays into both nostrils daily. 16 g 0    hydrOXYzine (ATARAX/VISTARIL) 25 MG tablet Take 1 tablet (25 mg total) by mouth every 6 (six) hours as needed for itching. 20 tablet 0   ibuprofen (ADVIL) 600 MG tablet Take 1 tablet (600 mg total) by mouth every 6 (six) hours as needed. 30 tablet 0   triamcinolone cream (KENALOG) 0.1 % Apply 1 application topically 2 (two) times daily. Apply for 2 weeks. May use on face 30 g 0   No facility-administered medications prior to visit.    PAST MEDICAL HISTORY: Past Medical History:  Diagnosis Date   Medical history non-contributory    Migraine     PAST SURGICAL HISTORY: Past Surgical History:  Procedure Laterality Date   WISDOM TOOTH EXTRACTION      FAMILY HISTORY: Family History  Problem Relation Age of Onset   Migraines Mother    Healthy Mother    Healthy Father    Migraines Maternal Uncle     SOCIAL HISTORY: Social History   Socioeconomic History   Marital status: Significant Other    Spouse name: Not on file   Number of children: 2   Years of education: Not on file   Highest education level: High school graduate  Occupational History   Not on file  Tobacco Use   Smoking status: Never   Smokeless tobacco: Never  Substance and Sexual Activity   Alcohol use: Yes    Comment: not often   Drug use: No   Sexual activity: Yes  Birth control/protection: Pill, None  Other Topics Concern   Not on file  Social History Narrative   Lives with children   Caffeine, maybe 1 x week   Social Determinants of Health   Financial Resource Strain: Not on file  Food Insecurity: Not on file  Transportation Needs: Not on file  Physical Activity: Not on file  Stress: Not on file  Social Connections: Not on file  Intimate Partner Violence: Not on file     PHYSICAL EXAM  GENERAL EXAM/CONSTITUTIONAL: Vitals:  Vitals:   07/30/21 1455  BP: 105/71  Pulse: 76  Weight: 194 lb 3.2 oz (88.1 kg)  Height: 5\' 7"  (1.702 m)   Body mass index is 30.42 kg/m. Wt Readings from Last  3 Encounters:  07/30/21 194 lb 3.2 oz (88.1 kg)  06/21/18 216 lb 1.3 oz (98 kg)  10/14/15 201 lb (91.2 kg)   Patient is in no distress; well developed, nourished and groomed; neck is supple  CARDIOVASCULAR: Examination of carotid arteries is normal; no carotid bruits Regular rate and rhythm, no murmurs Examination of peripheral vascular system by observation and palpation is normal  EYES: Ophthalmoscopic exam of optic discs and posterior segments is normal; no papilledema or hemorrhages No results found.  MUSCULOSKELETAL: Gait, strength, tone, movements noted in Neurologic exam below  NEUROLOGIC: MENTAL STATUS:  No flowsheet data found. awake, alert, oriented to person, place and time recent and remote memory intact normal attention and concentration language fluent, comprehension intact, naming intact fund of knowledge appropriate  CRANIAL NERVE:  2nd - no papilledema on fundoscopic exam 2nd, 3rd, 4th, 6th - pupils equal and reactive to light, visual fields full to confrontation, extraocular muscles intact, no nystagmus 5th - facial sensation symmetric 7th - facial strength symmetric 8th - hearing intact 9th - palate elevates symmetrically, uvula midline 11th - shoulder shrug symmetric 12th - tongue protrusion midline  MOTOR:  normal bulk and tone, full strength in the BUE, BLE  SENSORY:  normal and symmetric to light touch, temperature, vibration  COORDINATION:  finger-nose-finger, fine finger movements normal  REFLEXES:  deep tendon reflexes present and symmetric  GAIT/STATION:  narrow based gait     DIAGNOSTIC DATA (LABS, IMAGING, TESTING) - I reviewed patient records, labs, notes, testing and imaging myself where available.  Lab Results  Component Value Date   WBC 11.5 (H) 06/22/2018   HGB 11.4 (L) 06/22/2018   HCT 34.7 (L) 06/22/2018   MCV 84.6 06/22/2018   PLT 229 06/22/2018      Component Value Date/Time   NA 134 (L) 12/12/2017 1705   K  4.2 12/12/2017 1705   CL 102 12/12/2017 1705   CO2 23 12/12/2017 1705   GLUCOSE 83 12/12/2017 1705   BUN 7 12/12/2017 1705   CREATININE 0.52 12/12/2017 1705   CALCIUM 9.2 12/12/2017 1705   PROT 6.8 03/03/2015 1917   ALBUMIN 3.8 03/03/2015 1917   AST 16 03/03/2015 1917   ALT 9 03/03/2015 1917   ALKPHOS 51 03/03/2015 1917   BILITOT 0.2 (L) 03/03/2015 1917   GFRNONAA >60 12/12/2017 1705   GFRAA >60 12/12/2017 1705   No results found for: CHOL, HDL, LDLCALC, LDLDIRECT, TRIG, CHOLHDL No results found for: 12/14/2017 No results found for: VITAMINB12 No results found for: TSH      ASSESSMENT AND PLAN  25 y.o. year old female here with:   Dx:  1. Migraine with aura and with status migrainosus, not intractable       PLAN:  MIGRAINE TREATMENT PLAN:  MIGRAINE PREVENTION  LIFESTYLE CHANGES -Stop or avoid smoking -Decrease or avoid caffeine / alcohol -Eat and sleep on a regular schedule -Exercise several times per week - start topiramate 50mg  at bedtime; after 1-2 weeks increase to 50mg  twice a day; drink plenty of water   MIGRAINE RESCUE  - ibuprofen, tylenol as needed - rizatriptan (Maxalt) 10mg  as needed for breakthrough headache; may repeat x 1 after 2 hours; max 2 tabs per day or 8 per month - ubrogepant ) 100mg  as needed for breakthrough headache; may repeat x 1 after 2 hours; max 2 tabs per day or 8 per month   Meds ordered this encounter  Medications   topiramate (TOPAMAX) 50 MG tablet    Sig: Take 1 tablet (50 mg total) by mouth 2 (two) times daily.    Dispense:  60 tablet    Refill:  12   rizatriptan (MAXALT-MLT) 10 MG disintegrating tablet    Sig: Take 1 tablet (10 mg total) by mouth as needed for migraine. May repeat in 2 hours if needed    Dispense:  9 tablet    Refill:  11    Return in about 4 months (around 11/29/2021) for with NP (Whitney Lomax).    , MD 07/30/2021, 3:55 PM Certified in Neurology, Neurophysiology and  Neuroimaging  [facility] [address], [address] [place], 09/29/2021 [facility] [phone]

## 2021-07-30 NOTE — Patient Instructions (Signed)
  MIGRAINE PREVENTION  LIFESTYLE CHANGES -Stop or avoid smoking -Decrease or avoid caffeine / alcohol -Eat and sleep on a regular schedule -Exercise several times per week - start topiramate 50mg  at bedtime; after 1-2 weeks increase to 50mg  twice a day; drink plenty of water   MIGRAINE RESCUE  - ibuprofen, tylenol as needed - rizatriptan (Maxalt) 10mg  as needed for breakthrough headache; may repeat x 1 after 2 hours; max 2 tabs per day or 8 per month - ubrogepant ) 100mg  as needed for breakthrough headache; may repeat x 1 after 2 hours; max 2 tabs per day or 8 per month

## 2021-07-31 ENCOUNTER — Encounter: Payer: Self-pay | Admitting: Diagnostic Neuroimaging

## 2021-09-26 ENCOUNTER — Telehealth: Payer: Self-pay | Admitting: Diagnostic Neuroimaging

## 2021-09-26 MED ORDER — AIMOVIG 70 MG/ML ~~LOC~~ SOAJ: 70.0000 mg | SUBCUTANEOUS | 4 refills | Status: DC

## 2021-09-26 NOTE — Telephone Encounter (Signed)
Called patient and reviewed Dr Richrd Humbles message, recommendations, new Rx. I informed her Aimovig will require a pre auth that will be generated when her pharmacy runs the new Rx. Our office will do PA, if it is not approved she can go online for savings card. I informed her he is out of office next week but we have MDs to cover should she need dose pack or infusion. Sh ewill increase topamax as advised, let us know if she needs anything further. Patient verbalized understanding, appreciation.

## 2021-09-26 NOTE — Telephone Encounter (Signed)
Pt called wanting to know if something can be done in the office to help her with a headache that she can not get rid of. Please advise.

## 2021-09-26 NOTE — Telephone Encounter (Signed)
Start aimovig monthly injections.   May increase TPX to 50 / 100 x 1-2 weeks, then 100mg  twice a day   Consider coming in for migraine infusion (toradol, compazine; depacon if available).  Consider prednisone pack.    Meds ordered this encounter  Medications   Erenumab-aooe (AIMOVIG) 70 MG/ML SOAJ    Sig: Inject 70 mg into the skin every 30 (thirty) days.    Dispense:  3 mL    Refill:  4   , MD 09/26/2021, 4:50 PM Certified in Neurology, Neurophysiology and Neuroimaging  [facility] [address] [place], [place] [phone]

## 2021-09-26 NOTE — Telephone Encounter (Signed)
Called patient who stated her headache started yesterday. She has taken Rizatriptan twice today without relief, has not tried any OTC. She is taking Topiramate 50 mg twice daily. It is causing blurred vision and a stiff jaw, which are not new headache, migraine symptoms. She denied increased stress or sleep issues, I advised will discuss with Dr Marjory Lies and let her know. Patient verbalized understanding, appreciation.

## 2021-09-30 ENCOUNTER — Telehealth: Payer: Self-pay

## 2021-09-30 NOTE — Telephone Encounter (Signed)
I submitted a PA for Aimovig 70mg /mL on CMM, Key: [id number]. Awaiting determination from [facility].

## 2021-10-03 ENCOUNTER — Telehealth: Payer: Self-pay | Admitting: Diagnostic Neuroimaging

## 2021-10-03 NOTE — Telephone Encounter (Signed)
I do not see anything available for Amy in the near future, I am sorry!

## 2021-10-03 NOTE — Telephone Encounter (Signed)
Contacted pt, informed her that unfortunately we do not have anything available today, next availability is 10/18 1130, she was willing to take this slot. Advised to arrive 15-30 mins early for check in.

## 2021-10-03 NOTE — Telephone Encounter (Signed)
Do you all have any work ins available to get this pt in sooner? She has upcoming with Amy 12/8 but we have nothing for Dr Demetrius Charity, especially today. Aware not for Amy either today but anything sooner than Dec?

## 2021-10-03 NOTE — Telephone Encounter (Signed)
Pt called, had a migraine 10/7 Friday, increased Topiramate to 100 mg and migraine went away. Had Monday and Tuesday, woke up today with migraine in eyes, ear. Would like a call from the nurse to discuss getting seen today.

## 2021-10-07 NOTE — Telephone Encounter (Signed)
Determination received: approved 09/30/21-10/07/22

## 2021-10-08 ENCOUNTER — Encounter: Payer: Self-pay | Admitting: Diagnostic Neuroimaging

## 2021-10-08 ENCOUNTER — Ambulatory Visit: Payer: Managed Care, Other (non HMO) | Admitting: Diagnostic Neuroimaging

## 2021-10-08 ENCOUNTER — Telehealth: Payer: Self-pay | Admitting: *Deleted

## 2021-10-08 VITALS — BP 110/70 | HR 64 | Ht 67.0 in | Wt 189.0 lb

## 2021-10-08 DIAGNOSIS — G43101 Migraine with aura, not intractable, with status migrainosus: Secondary | ICD-10-CM

## 2021-10-08 MED ORDER — UBRELVY 50 MG PO TABS: 50.0000 mg | ORAL_TABLET | ORAL | 6 refills | Status: DC | PRN

## 2021-10-08 NOTE — Telephone Encounter (Signed)
[employer] :Approved Ubrelvy Coverage Start Date:10/08/2021,  End Date:10/08/2022.

## 2021-10-08 NOTE — Telephone Encounter (Signed)
Bernita Raisin PA, key:  [id number], [id number]. tried failed sumatriptan, rizatriptan.  An electronic determination will be received in CoverMyMeds within 72-120 hours. You will receive a fax copy of the determination. If Rosann Auerbach has not responded in 120 hours, contact [employer] at [phone].

## 2021-10-08 NOTE — Patient Instructions (Addendum)
  MIGRAINE PREVENTION  - continue TPX 50 / 100; may reduce to 50mg  twice a day    MIGRAINE RESCUE  - continue ibuprofen, tylenol as needed  - continue rizatriptan (Maxalt) 10mg  as needed for breakthrough headache; may repeat x 1 after 2 hours; max 2 tabs per day or 8 per month  - start ubrogepant ) 50mg  as needed for breakthrough headache; may repeat x 1 after 2 hours; max 2 tabs per day or 8 per month

## 2021-10-08 NOTE — Progress Notes (Signed)
[facility]  PATIENT: Whitney Martin DOB: November 02, 1996  REFERRING CLINICIAN: [facility] [place] * HISTORY FROM: patient  REASON FOR VISIT: follow up   HISTORICAL  CHIEF COMPLAINT:  Chief Complaint  Patient presents with   Migraine    Rm 6 Early FU requested d/t worsening migraines "was unaware Aimovig is approved, hasn't started it yet"     HISTORY OF PRESENT ILLNESS:   UPDATE (10/08/21, VRP): Since last visit, continues with 3-4 migraine per week. Now up to TPX 50 / 100 without benefit. Rizatriptan not helping that much. AImovig recently approved, and patient plans to start soon.   PRIOR HPI: 07/30/21: 25 year old female here for evaluation of headaches.  Patient has had headaches since age 51 years old with global severe headaches, phonophobia, neck and shoulder pain.  Headaches can last hours or days at a time.  Initially was having 2 headaches per week but now having up to 3 to 4/week.  Sometimes has nausea and sensitive to light and sound.  Sometimes has spots and sparkles in her visual fields.  Sometimes with sweating and blurred vision.  Family history of migraine in her mother.  Triggers include decreased sleep, stress and weather change.   REVIEW OF SYSTEMS: Full 14 system review of systems performed and negative with exception of: As per HPI.  ALLERGIES: No Known Allergies  HOME MEDICATIONS: Outpatient Medications Prior to Visit  Medication Sig Dispense Refill   aspirin-acetaminophen-caffeine (EXCEDRIN MIGRAINE) 250-250-65 MG tablet Take by mouth every 6 (six) hours as needed for headache.     cyclobenzaprine (FLEXERIL) 5 MG tablet Take 5 mg by mouth daily.     Erenumab-aooe (AIMOVIG) 70 MG/ML SOAJ Inject 70 mg into the skin every 30 (thirty) days. 3 mL 4   naproxen (NAPROSYN) 500 MG tablet naproxen 500 mg tablet     norgestimate-ethinyl estradiol (ORTHO-CYCLEN) 0.25-35 MG-MCG tablet Sprintec (28) 0.25 mg-35 mcg tablet  TAKE ONE TABLET BY  MOUTH EVERY DAY     rizatriptan (MAXALT-MLT) 10 MG disintegrating tablet Take 1 tablet (10 mg total) by mouth as needed for migraine. May repeat in 2 hours if needed 9 tablet 11   sertraline (ZOLOFT) 100 MG tablet Take 100 mg by mouth daily.     topiramate (TOPAMAX) 50 MG tablet Take 1 tablet (50 mg total) by mouth 2 (two) times daily. 60 tablet 12   PORTIA-28 0.15-30 MG-MCG tablet Take by mouth daily.     No facility-administered medications prior to visit.    PAST MEDICAL HISTORY: Past Medical History:  Diagnosis Date   Medical history non-contributory    Migraine     PAST SURGICAL HISTORY: Past Surgical History:  Procedure Laterality Date   WISDOM TOOTH EXTRACTION      FAMILY HISTORY: Family History  Problem Relation Age of Onset   Migraines Mother    Healthy Mother    Healthy Father    Migraines Maternal Uncle     SOCIAL HISTORY: Social History   Socioeconomic History   Marital status: Significant Other    Spouse name: Not on file   Number of children: 2   Years of education: Not on file   Highest education level: High school graduate  Occupational History   Not on file  Tobacco Use   Smoking status: Never   Smokeless tobacco: Never  Substance and Sexual Activity   Alcohol use: Yes    Comment: not often   Drug use: No   Sexual activity: Yes  Birth control/protection: Pill, None  Other Topics Concern   Not on file  Social History Narrative   Lives with children   Caffeine, maybe 1 x week   Social Determinants of Health   Financial Resource Strain: Not on file  Food Insecurity: Not on file  Transportation Needs: Not on file  Physical Activity: Not on file  Stress: Not on file  Social Connections: Not on file  Intimate Partner Violence: Not on file     PHYSICAL EXAM  GENERAL EXAM/CONSTITUTIONAL: Vitals:  Vitals:   10/08/21 1123  BP: 110/70  Pulse: 64  Weight: 189 lb (85.7 kg)  Height: 5\' 7"  (1.702 m)   Body mass index is 29.6  kg/m. Wt Readings from Last 3 Encounters:  10/08/21 189 lb (85.7 kg)  07/30/21 194 lb 3.2 oz (88.1 kg)  06/21/18 216 lb 1.3 oz (98 kg)   Patient is in no distress; well developed, nourished and groomed; neck is supple  CARDIOVASCULAR: Examination of carotid arteries is normal; no carotid bruits Regular rate and rhythm, no murmurs Examination of peripheral vascular system by observation and palpation is normal  EYES: Ophthalmoscopic exam of optic discs and posterior segments is normal; no papilledema or hemorrhages No results found.  MUSCULOSKELETAL: Gait, strength, tone, movements noted in Neurologic exam below  NEUROLOGIC: MENTAL STATUS:  No flowsheet data found. awake, alert, oriented to person, place and time recent and remote memory intact normal attention and concentration language fluent, comprehension intact, naming intact fund of knowledge appropriate  CRANIAL NERVE:  2nd - no papilledema on fundoscopic exam 2nd, 3rd, 4th, 6th - pupils equal and reactive to light, visual fields full to confrontation, extraocular muscles intact, no nystagmus 5th - facial sensation symmetric 7th - facial strength symmetric 8th - hearing intact 9th - palate elevates symmetrically, uvula midline 11th - shoulder shrug symmetric 12th - tongue protrusion midline  MOTOR:  normal bulk and tone, full strength in the BUE, BLE  SENSORY:  normal and symmetric to light touch, temperature, vibration  COORDINATION:  finger-nose-finger, fine finger movements normal  REFLEXES:  deep tendon reflexes present and symmetric  GAIT/STATION:  narrow based gait     DIAGNOSTIC DATA (LABS, IMAGING, TESTING) - I reviewed patient records, labs, notes, testing and imaging myself where available.  Lab Results  Component Value Date   WBC 11.5 (H) 06/22/2018   HGB 11.4 (L) 06/22/2018   HCT 34.7 (L) 06/22/2018   MCV 84.6 06/22/2018   PLT 229 06/22/2018      Component Value Date/Time   NA  134 (L) 12/12/2017 1705   K 4.2 12/12/2017 1705   CL 102 12/12/2017 1705   CO2 23 12/12/2017 1705   GLUCOSE 83 12/12/2017 1705   BUN 7 12/12/2017 1705   CREATININE 0.52 12/12/2017 1705   CALCIUM 9.2 12/12/2017 1705   PROT 6.8 03/03/2015 1917   ALBUMIN 3.8 03/03/2015 1917   AST 16 03/03/2015 1917   ALT 9 03/03/2015 1917   ALKPHOS 51 03/03/2015 1917   BILITOT 0.2 (L) 03/03/2015 1917   GFRNONAA >60 12/12/2017 1705   GFRAA >60 12/12/2017 1705   No results found for: CHOL, HDL, LDLCALC, LDLDIRECT, TRIG, CHOLHDL No results found for: 12/14/2017 No results found for: VITAMINB12 No results found for: TSH      ASSESSMENT AND PLAN  25 y.o. year old female here with:   Dx:  1. Migraine with aura and with status migrainosus, not intractable      PLAN:  MIGRAINE TREATMENT PLAN:  MIGRAINE PREVENTION  - continue TPX 50 / 100; may reduce to 50mg  twice a day    MIGRAINE RESCUE  - continue ibuprofen, tylenol as needed - continue rizatriptan (Maxalt) 10mg  as needed for breakthrough headache; may repeat x 1 after 2 hours; max 2 tabs per day or 8 per month - start ubrogepant ) 50mg  as needed for breakthrough headache; may repeat x 1 after 2 hours; max 2 tabs per day or 8 per month   Meds ordered this encounter  Medications   Ubrogepant (UBRELVY) 50 MG TABS    Sig: Take 50 mg by mouth as needed. May repeat x 1 tab after 2 hours; max 2 tabs per day or 8 per month    Dispense:  8 tablet    Refill:  6   Return in about 4 months (around 02/08/2022) for MyChart visit (Bernita Raisin).    , MD 10/08/2021, 6:47 PM Certified in Neurology, Neurophysiology and Neuroimaging  [facility] [address], [address] [place], [facility] [address] [phone]

## 2021-10-31 ENCOUNTER — Other Ambulatory Visit: Payer: Self-pay | Admitting: Neurology

## 2021-10-31 ENCOUNTER — Encounter: Payer: Self-pay | Admitting: Family Medicine

## 2021-10-31 MED ORDER — PREDNISONE 10 MG (21) PO TBPK: ORAL_TABLET | ORAL | 0 refills | Status: DC

## 2021-11-18 ENCOUNTER — Other Ambulatory Visit: Payer: Self-pay | Admitting: Family Medicine

## 2021-11-18 DIAGNOSIS — R0789 Other chest pain: Secondary | ICD-10-CM

## 2021-11-18 DIAGNOSIS — R1011 Right upper quadrant pain: Secondary | ICD-10-CM

## 2021-11-18 DIAGNOSIS — M549 Dorsalgia, unspecified: Secondary | ICD-10-CM

## 2021-11-19 ENCOUNTER — Ambulatory Visit
Admission: RE | Admit: 2021-11-19 | Discharge: 2021-11-19 | Disposition: A | Discharge status: Home / Self Care | Payer: 59 | Source: Ambulatory Visit | Attending: Family Medicine | Admitting: Family Medicine

## 2021-11-19 DIAGNOSIS — R0789 Other chest pain: Secondary | ICD-10-CM

## 2021-11-19 DIAGNOSIS — M549 Dorsalgia, unspecified: Secondary | ICD-10-CM

## 2021-11-19 DIAGNOSIS — R1011 Right upper quadrant pain: Secondary | ICD-10-CM

## 2021-11-27 NOTE — Progress Notes (Signed)
Chief Complaint  Patient presents with   Follow-up    Rm 2, alone. Here for 4 month migraine f/u. Pt reports migraines have improved since taking Aimovig and Ubrelvy.     HISTORY OF PRESENT ILLNESS:  11/28/21 ALL:  Whitney Martin is a 25 y.o. female here today for follow up for migraines. She was advised to reduce topiramate to 50mg  BID and start Amovig 70mg  at last visit 09/2021. Migraines are improved. She has taken 2 injections. She is tolerating medication. did help with abortive therapy. She has actually weaned topiramate. She was concerned about effects on menstrual cycle and effectiveness of birth control. She has had about 2-4 headache days per month with only 1 migraine over the last 2 months.   HISTORY (copied from Dr 10/2021 previous note)  UPDATE (10/08/21, VRP): Since last visit, continues with 3-4 migraine per week. Now up to TPX 50 / 100 without benefit. Rizatriptan not helping that much. AImovig recently approved, and patient plans to start soon.    PRIOR HPI: 07/30/21: 25 year old female here for evaluation of headaches.  Patient has had headaches since age 25 years old with global severe headaches, phonophobia, neck and shoulder pain.  Headaches can last hours or days at a time.  Initially was having 2 headaches per week but now having up to 3 to 4/week.  Sometimes has nausea and sensitive to light and sound.  Sometimes has spots and sparkles in her visual fields.  Sometimes with sweating and blurred vision.  Family history of migraine in her mother.  Triggers include decreased sleep, stress and weather change.  REVIEW OF SYSTEMS: Out of a complete 14 system review of symptoms, the patient complains only of the following symptoms, headaches and all other reviewed systems are negative.   ALLERGIES: No Known Allergies   HOME MEDICATIONS: Outpatient Medications Prior to Visit  Medication Sig Dispense Refill   aspirin-acetaminophen-caffeine (EXCEDRIN  MIGRAINE) 250-250-65 MG tablet Take by mouth every 6 (six) hours as needed for headache.     cyclobenzaprine (FLEXERIL) 5 MG tablet Take 5 mg by mouth daily.     Erenumab-aooe (AIMOVIG) 70 MG/ML SOAJ Inject 70 mg into the skin every 30 (thirty) days. 3 mL 4   naproxen (NAPROSYN) 500 MG tablet naproxen 500 mg tablet     norgestimate-ethinyl estradiol (ORTHO-CYCLEN) 0.25-35 MG-MCG tablet Sprintec (28) 0.25 mg-35 mcg tablet  TAKE ONE TABLET BY MOUTH EVERY DAY     predniSONE (STERAPRED UNI-PAK 21 TAB) 10 MG (21) TBPK tablet Taper pack as directed 1 each 0   rizatriptan (MAXALT-MLT) 10 MG disintegrating tablet Take 1 tablet (10 mg total) by mouth as needed for migraine. May repeat in 2 hours if needed 9 tablet 11   sertraline (ZOLOFT) 100 MG tablet Take 100 mg by mouth daily.     topiramate (TOPAMAX) 50 MG tablet Take 1 tablet (50 mg total) by mouth 2 (two) times daily. 60 tablet 12   Ubrogepant (UBRELVY) 50 MG TABS Take 50 mg by mouth as needed. May repeat x 1 tab after 2 hours; max 2 tabs per day or 8 per month 8 tablet 6   No facility-administered medications prior to visit.     PAST MEDICAL HISTORY: Past Medical History:  Diagnosis Date   Medical history non-contributory    Migraine      PAST SURGICAL HISTORY: Past Surgical History:  Procedure Laterality Date   WISDOM TOOTH EXTRACTION       FAMILY HISTORY: Family  History  Problem Relation Age of Onset   Migraines Mother    Healthy Mother    Healthy Father    Migraines Maternal Uncle      SOCIAL HISTORY: Social History   Socioeconomic History   Marital status: Significant Other    Spouse name: Not on file   Number of children: 2   Years of education: Not on file   Highest education level: High school graduate  Occupational History   Not on file  Tobacco Use   Smoking status: Never   Smokeless tobacco: Never  Substance and Sexual Activity   Alcohol use: Yes    Comment: not often   Drug use: No   Sexual  activity: Yes    Birth control/protection: Pill, None  Other Topics Concern   Not on file  Social History Narrative   Lives with children   Caffeine, maybe 1 x week   Social Determinants of Health   Financial Resource Strain: Not on file  Food Insecurity: Not on file  Transportation Needs: Not on file  Physical Activity: Not on file  Stress: Not on file  Social Connections: Not on file  Intimate Partner Violence: Not on file     PHYSICAL EXAM  Vitals:   11/28/21 0906  BP: 102/62  Pulse: 85  SpO2: 97%  Weight: 187 lb 8 oz (85 kg)  Height: 5\' 7"  (1.702 m)   Body mass index is 29.37 kg/m.  Generalized: Well developed, in no acute distress  Cardiology: normal rate and rhythm, no murmur auscultated  Respiratory: clear to auscultation bilaterally    Neurological examination  Mentation: Alert oriented to time, place, history taking. Follows all commands speech and language fluent Cranial nerve II-XII: Pupils were equal round reactive to light. Extraocular movements were full, visual field were full on confrontational test. Facial sensation and strength were normal. Head turning and shoulder shrug  were normal and symmetric. Motor: The motor testing reveals 5 over 5 strength of all 4 extremities. Good symmetric motor tone is noted throughout.  Gait and station: Gait is normal.    DIAGNOSTIC DATA (LABS, IMAGING, TESTING) - I reviewed patient records, labs, notes, testing and imaging myself where available.  Lab Results  Component Value Date   WBC 11.5 (H) 06/22/2018   HGB 11.4 (L) 06/22/2018   HCT 34.7 (L) 06/22/2018   MCV 84.6 06/22/2018   PLT 229 06/22/2018      Component Value Date/Time   NA 134 (L) 12/12/2017 1705   K 4.2 12/12/2017 1705   CL 102 12/12/2017 1705   CO2 23 12/12/2017 1705   GLUCOSE 83 12/12/2017 1705   BUN 7 12/12/2017 1705   CREATININE 0.52 12/12/2017 1705   CALCIUM 9.2 12/12/2017 1705   PROT 6.8 03/03/2015 1917   ALBUMIN 3.8 03/03/2015  1917   AST 16 03/03/2015 1917   ALT 9 03/03/2015 1917   ALKPHOS 51 03/03/2015 1917   BILITOT 0.2 (L) 03/03/2015 1917   GFRNONAA >60 12/12/2017 1705   GFRAA >60 12/12/2017 1705   No results found for: CHOL, HDL, LDLCALC, LDLDIRECT, TRIG, CHOLHDL No results found for: 12/14/2017 No results found for: VITAMINB12 No results found for: TSH  No flowsheet data found.   No flowsheet data found.   ASSESSMENT AND PLAN  25 y.o. year old female  has a past medical history of Medical history non-contributory and Migraine. here with    Migraine with aura and with status migrainosus, not intractable  Kristiana is doing well.  We will continue Amovig 70mg  injections every 30 days and Ubrelvy as needed. She will continue healthy lifestyle habits. Follow up in 1 year.   No orders of the defined types were placed in this encounter.    No orders of the defined types were placed in this encounter.    , MSN, FNP-C 11/28/2021, 9:21 AM  [facility] [address], [address] [place], [place] [phone]

## 2021-11-27 NOTE — Patient Instructions (Signed)
Below is our plan:  We will continue Amovig injections every 30 days and Ubrelvy as needed.   Please make sure you are staying well hydrated. I recommend 50-60 ounces daily. Well balanced diet and regular exercise encouraged. Consistent sleep schedule with 6-8 hours recommended.   Please continue follow up with care team as directed.   Follow up with me in 1 year  You may receive a survey regarding today's visit. I encourage you to leave honest feed back as I do use this information to improve patient care. Thank you for seeing me today!

## 2021-11-28 ENCOUNTER — Ambulatory Visit: Payer: Managed Care, Other (non HMO) | Admitting: Family Medicine

## 2021-11-28 ENCOUNTER — Encounter: Payer: Self-pay | Admitting: Family Medicine

## 2021-11-28 VITALS — BP 102/62 | HR 85 | Ht 67.0 in | Wt 187.5 lb

## 2021-11-28 DIAGNOSIS — G43101 Migraine with aura, not intractable, with status migrainosus: Secondary | ICD-10-CM | POA: Diagnosis not present

## 2022-03-25 ENCOUNTER — Telehealth: Payer: Self-pay | Admitting: *Deleted

## 2022-03-25 NOTE — Telephone Encounter (Signed)
Faxed completed PA Ubrevly to [facility] at [phone]. Received fax confirmation. Waiting on determination. ?

## 2022-03-26 NOTE — Telephone Encounter (Signed)
Received fax from [facility] that PA cx because the medication was previously approved on [id number] from 10/08/21-10/08/22. If pharmacy having a hard time processing, they can call [facility] pharmacy help desk at [phone].  ?

## 2022-04-09 ENCOUNTER — Other Ambulatory Visit: Payer: Self-pay | Admitting: Diagnostic Neuroimaging

## 2022-06-05 ENCOUNTER — Encounter: Payer: Self-pay | Admitting: Family Medicine

## 2022-06-05 ENCOUNTER — Other Ambulatory Visit: Payer: Self-pay | Admitting: Neurology

## 2022-06-05 MED ORDER — PREDNISONE 10 MG (21) PO TBPK: ORAL_TABLET | ORAL | 0 refills | Status: DC

## 2022-07-29 NOTE — Progress Notes (Unsigned)
No chief complaint on file.   HISTORY OF PRESENT ILLNESS:  07/29/22 ALL:  Whitney Martin returns for follow up for migraines. She continues Amovig 70mg  monthly and Ubrelvy as needed.   11/28/2021 ALL: Whitney Martin is a 26 y.o. female here today for follow up for migraines. She was advised to reduce topiramate to 50mg  BID and start Amovig 70mg  at last visit 09/2021. Migraines are improved. She has taken 2 injections. She is tolerating medication. did help with abortive therapy. She has actually weaned topiramate. She was concerned about effects on menstrual cycle and effectiveness of birth control. She has had about 2-4 headache days per month with only 1 migraine over the last 2 months.   HISTORY (copied from Dr previous note)  UPDATE (10/08/21, VRP): Since last visit, continues with 3-4 migraine per week. Now up to TPX 50 / 100 without benefit. Rizatriptan not helping that much. AImovig recently approved, and patient plans to start soon.    PRIOR HPI: 07/30/21: 26 year old female here for evaluation of headaches.  Patient has had headaches since age 9 years old with global severe headaches, phonophobia, neck and shoulder pain.  Headaches can last hours or days at a time.  Initially was having 2 headaches per week but now having up to 3 to 4/week.  Sometimes has nausea and sensitive to light and sound.  Sometimes has spots and sparkles in her visual fields.  Sometimes with sweating and blurred vision.  Family history of migraine in her mother.  Triggers include decreased sleep, stress and weather change.  REVIEW OF SYSTEMS: Out of a complete 14 system review of symptoms, the patient complains only of the following symptoms, headaches and all other reviewed systems are negative.   ALLERGIES: No Known Allergies   HOME MEDICATIONS: Outpatient Medications Prior to Visit  Medication Sig Dispense Refill   cyclobenzaprine (FLEXERIL) 5 MG tablet Take 5 mg by mouth daily.      Erenumab-aooe (AIMOVIG) 70 MG/ML SOAJ Inject 70 mg into the skin every 30 (thirty) days. 3 mL 4   naproxen (NAPROSYN) 500 MG tablet naproxen 500 mg tablet     norgestimate-ethinyl estradiol (ORTHO-CYCLEN) 0.25-35 MG-MCG tablet Sprintec (28) 0.25 mg-35 mcg tablet  TAKE ONE TABLET BY MOUTH EVERY DAY     predniSONE (STERAPRED UNI-PAK 21 TAB) 10 MG (21) TBPK tablet Taper pack as directed 1 each 0   sertraline (ZOLOFT) 100 MG tablet Take 100 mg by mouth daily.     UBRELVY 50 MG TABS TAKE 50 MG BY MOUTH AS NEEDED . MAY REPEAT ONE TABLET AFTER TWO HOURS. ***MAX TWO TABLETS PER DAY OR EIGHT PER MONTH 10 tablet 5   No facility-administered medications prior to visit.     PAST MEDICAL HISTORY: Past Medical History:  Diagnosis Date   Medical history non-contributory    Migraine      PAST SURGICAL HISTORY: Past Surgical History:  Procedure Laterality Date   WISDOM TOOTH EXTRACTION       FAMILY HISTORY: Family History  Problem Relation Age of Onset   Migraines Mother    Healthy Mother    Healthy Father    Migraines Maternal Uncle      SOCIAL HISTORY: Social History   Socioeconomic History   Marital status: Significant Other    Spouse name: Not on file   Number of children: 2   Years of education: Not on file   Highest education level: High school graduate  Occupational History   Not  on file  Tobacco Use   Smoking status: Never   Smokeless tobacco: Never  Substance and Sexual Activity   Alcohol use: Yes    Comment: not often   Drug use: No   Sexual activity: Yes    Birth control/protection: Pill, None  Other Topics Concern   Not on file  Social History Narrative   Lives with children   Caffeine, maybe 1 x week   Social Determinants of Health   Financial Resource Strain: Not on file  Food Insecurity: Not on file  Transportation Needs: Not on file  Physical Activity: Not on file  Stress: Not on file  Social Connections: Not on file  Intimate Partner Violence:  Not on file     PHYSICAL EXAM  There were no vitals filed for this visit.  There is no height or weight on file to calculate BMI.  Generalized: Well developed, in no acute distress  Cardiology: normal rate and rhythm, no murmur auscultated  Respiratory: clear to auscultation bilaterally    Neurological examination  Mentation: Alert oriented to time, place, history taking. Follows all commands speech and language fluent Cranial nerve II-XII: Pupils were equal round reactive to light. Extraocular movements were full, visual field were full on confrontational test. Facial sensation and strength were normal. Head turning and shoulder shrug  were normal and symmetric. Motor: The motor testing reveals 5 over 5 strength of all 4 extremities. Good symmetric motor tone is noted throughout.  Gait and station: Gait is normal.    DIAGNOSTIC DATA (LABS, IMAGING, TESTING) - I reviewed patient records, labs, notes, testing and imaging myself where available.  Lab Results  Component Value Date   WBC 11.5 (H) 06/22/2018   HGB 11.4 (L) 06/22/2018   HCT 34.7 (L) 06/22/2018   MCV 84.6 06/22/2018   PLT 229 06/22/2018      Component Value Date/Time   NA 134 (L) 12/12/2017 1705   K 4.2 12/12/2017 1705   CL 102 12/12/2017 1705   CO2 23 12/12/2017 1705   GLUCOSE 83 12/12/2017 1705   BUN 7 12/12/2017 1705   CREATININE 0.52 12/12/2017 1705   CALCIUM 9.2 12/12/2017 1705   PROT 6.8 03/03/2015 1917   ALBUMIN 3.8 03/03/2015 1917   AST 16 03/03/2015 1917   ALT 9 03/03/2015 1917   ALKPHOS 51 03/03/2015 1917   BILITOT 0.2 (L) 03/03/2015 1917   GFRNONAA >60 12/12/2017 1705   GFRAA >60 12/12/2017 1705   No results found for: "CHOL", "HDL", "LDLCALC", "LDLDIRECT", "TRIG", "CHOLHDL" No results found for: "HGBA1C" No results found for: "VITAMINB12" No results found for: "TSH"      No data to display               No data to display           ASSESSMENT AND PLAN  26 y.o. year old  female  has a past medical history of Medical history non-contributory and Migraine. here with    No diagnosis found.  Whitney Martin is doing well. We will continue Amovig 70mg  injections every 30 days and Ubrelvy as needed. She will continue healthy lifestyle habits. Follow up in 1 year.   No orders of the defined types were placed in this encounter.     No orders of the defined types were placed in this encounter.    , MSN, FNP-C 07/29/2022, 3:54 PM  [facility] [address] [place], [place] [phone]

## 2022-07-30 ENCOUNTER — Telehealth: Payer: Self-pay | Admitting: Neurology

## 2022-07-30 ENCOUNTER — Encounter: Payer: Self-pay | Admitting: Family Medicine

## 2022-07-30 ENCOUNTER — Ambulatory Visit: Payer: Managed Care, Other (non HMO) | Admitting: Family Medicine

## 2022-07-30 VITALS — BP 128/79 | HR 79 | Ht 67.0 in | Wt 207.5 lb

## 2022-07-30 DIAGNOSIS — G43101 Migraine with aura, not intractable, with status migrainosus: Secondary | ICD-10-CM | POA: Diagnosis not present

## 2022-07-30 MED ORDER — UBRELVY 100 MG PO TABS: 100.0000 mg | ORAL_TABLET | Freq: Every day | ORAL | 11 refills | Status: DC | PRN

## 2022-07-30 MED ORDER — ERENUMAB-AOOE 140 MG/ML ~~LOC~~ SOAJ: 140.0000 mg | SUBCUTANEOUS | 3 refills | Status: DC

## 2022-07-30 NOTE — Telephone Encounter (Signed)
Request Reference Number: [id number]. AIMOVIG INJ 140MG /ML is approved through 01/30/2023. Your patient may now fill this prescription and it will be covered.

## 2022-07-30 NOTE — Patient Instructions (Signed)
Below is our plan:  We will increase Amovig to 140mg  monthly and increase Ubrelvy to 100mg  as needed.   Please make sure you are staying well hydrated. I recommend 50-60 ounces daily. Well balanced diet and regular exercise encouraged. Consistent sleep schedule with 6-8 hours recommended.   Please continue follow up with care team as directed.   Follow up with me in 6 months  You may receive a survey regarding today's visit. I encourage you to leave honest feed back as I do use this information to improve patient care. Thank you for seeing me today!

## 2022-07-30 NOTE — Telephone Encounter (Signed)
PA completed on CMM/[facility] [id number] Will await response

## 2022-07-30 NOTE — Telephone Encounter (Signed)
PA completed on CMM/[facility] [id number]

## 2022-07-31 ENCOUNTER — Telehealth: Payer: Self-pay | Admitting: Neurology

## 2022-07-31 MED ORDER — NURTEC 75 MG PO TBDP: 75.0000 mg | ORAL_TABLET | ORAL | 0 refills | Status: DC | PRN

## 2022-07-31 NOTE — Telephone Encounter (Signed)
PA denied. Will try nurtec instead

## 2022-07-31 NOTE — Addendum Note (Signed)
Addended by: Marlana Latus C on: 07/31/2022 04:00 PM   Modules accepted: Orders

## 2022-07-31 NOTE — Telephone Encounter (Signed)
PA completed on CMM/[facility] RX KEY:  [id number] Approved immediately  Request Reference Number: [id number]. NURTEC TAB 75MG  ODT is approved through 10/31/2022.

## 2022-08-01 ENCOUNTER — Encounter: Payer: Self-pay | Admitting: Family Medicine

## 2022-09-16 ENCOUNTER — Encounter: Payer: Self-pay | Admitting: Family Medicine

## 2022-09-16 NOTE — Telephone Encounter (Signed)
Called pt. She will come at 2pm today for migraine infusion. AL,NP approved.  Provided signed order to intrafusion: depacon 1G IV in 77ml over 5 min, solumedrol 125mg  IV in 83mll over 2 min, toradol 30mg  IVP over at least 15 seconds, compazine 10mg  IVP.  Confirmed she has no known allergies and will have driver to and from infusion.

## 2022-09-19 ENCOUNTER — Other Ambulatory Visit: Payer: Self-pay | Admitting: Diagnostic Neuroimaging

## 2022-09-24 ENCOUNTER — Ambulatory Visit: Payer: 59 | Admitting: Family Medicine

## 2022-10-06 ENCOUNTER — Telehealth: Payer: Self-pay | Admitting: *Deleted

## 2022-10-06 NOTE — Telephone Encounter (Signed)
Submitted PA Nurtec on CMM. Key: [id number]. Waiting on determination from [facility].

## 2022-10-06 NOTE — Telephone Encounter (Signed)
Request Reference Number: [id number]. NURTEC TAB 75MG  ODT is approved through 10/07/2023. Your patient may now fill this prescription and it will be covered.

## 2022-10-09 ENCOUNTER — Other Ambulatory Visit: Payer: Self-pay

## 2022-10-09 MED ORDER — NURTEC 75 MG PO TBDP: 75.0000 mg | ORAL_TABLET | ORAL | 0 refills | Status: DC | PRN

## 2022-11-27 NOTE — Progress Notes (Signed)
Chief Complaint  Patient presents with   Follow-up    Pt in room #2 and alone. Pt here today for f/u on her migraines.    HISTORY OF PRESENT ILLNESS:  12/01/22 ALL:  Whitney Martin returns for follow up for migraines. We increased Amovig to 140mg  and Ubrelvy to 100mg  at last visit 07/2022. Insurance preferred . Since, she reports improvement in headache days. She may have 3-4 headaches a month. Most are mild. She is able to abort with ibuprofen. She ahs taken Nurtec three times since 07/2022. It works well. She does complain of a pressure sensation in bifrontal area regularly.   07/30/2022 ALL: Whitney Martin returns for follow up for migraines. She continues Amovig 70mg  monthly and Ubrelvy 50mg  as needed. She reports initial improvement in headache frequency and intensity but over the past 3-4 months has noted worsening. She is having at least 12 migraine day a month. 08/2022 helps but she usually has to take 2 doses with each migraine. She is running out of rx every month. She endorses some weight gain and recently started on Nuvaring.   11/28/2021 ALL: Whitney Martin is a 26 y.o. female here today for follow up for migraines. She was advised to reduce topiramate to 50mg  BID and start Amovig 70mg  at last visit 09/2021. Migraines are improved. She has taken 2 injections. She is tolerating medication. 14/07/2021 did help with abortive therapy. She has actually weaned topiramate. She was concerned about effects on menstrual cycle and effectiveness of birth control. She has had about 2-4 headache days per month with only 1 migraine over the last 2 months.   HISTORY (copied from Dr Joseph Berkshire previous note)  UPDATE (10/08/21, VRP): Since last visit, continues with 3-4 migraine per week. Now up to TPX 50 / 100 without benefit. Rizatriptan not helping that much. AImovig recently approved, and patient plans to start soon.    PRIOR HPI: 07/30/21: 26 year old female here for evaluation of headaches.  Patient  has had headaches since age 28 years old with global severe headaches, phonophobia, neck and shoulder pain.  Headaches can last hours or days at a time.  Initially was having 2 headaches per week but now having up to 3 to 4/week.  Sometimes has nausea and sensitive to light and sound.  Sometimes has spots and sparkles in her visual fields.  Sometimes with sweating and blurred vision.  Family history of migraine in her mother.  Triggers include decreased sleep, stress and weather change.   REVIEW OF SYSTEMS: Out of a complete 14 system review of symptoms, the patient complains only of the following symptoms, headaches, weight gain and all other reviewed systems are negative.   ALLERGIES: No Known Allergies   HOME MEDICATIONS:     PAST MEDICAL HISTORY: Past Medical History:  Diagnosis Date   Medical history non-contributory    Migraine      PAST SURGICAL HISTORY: Past Surgical History:  Procedure Laterality Date   WISDOM TOOTH EXTRACTION       FAMILY HISTORY: Family History  Problem Relation Age of Onset   Migraines Mother    Healthy Mother    Healthy Father    Migraines Maternal Uncle      SOCIAL HISTORY: Social History   Socioeconomic History   Marital status: Significant Other    Spouse name: Not on file   Number of children: 2   Years of education: Not on file   Highest education level: High school graduate  Occupational History  Not on file  Tobacco Use   Smoking status: Never   Smokeless tobacco: Never  Substance and Sexual Activity   Alcohol use: Yes    Comment: not often   Drug use: No   Sexual activity: Yes    Birth control/protection: Pill, None  Other Topics Concern   Not on file  Social History Narrative   Lives with children   Caffeine, maybe 1 x week   Social Determinants of Health   Financial Resource Strain: Not on file  Food Insecurity: Not on file  Transportation Needs: Not on file  Physical Activity: Not on file  Stress: Not on  file  Social Connections: Not on file  Intimate Partner Violence: Not on file     PHYSICAL EXAM  Vitals:   12/01/22 1004  BP: 115/72  Pulse: 78  Weight: 215 lb (97.5 kg)  Height: 5\' 7"  (1.702 m)     Body mass index is 33.67 kg/m.  Generalized: Well developed, in no acute distress  Cardiology: normal rate and rhythm, no murmur auscultated  Respiratory: clear to auscultation bilaterally    Neurological examination  Mentation: Alert oriented to time, place, history taking. Follows all commands speech and language fluent Cranial nerve II-XII: Pupils were equal round reactive to light. Extraocular movements were full, visual field were full on confrontational test. Facial sensation and strength were normal. Head turning and shoulder shrug  were normal and symmetric. Motor: The motor testing reveals 5 over 5 strength of all 4 extremities. Good symmetric motor tone is noted throughout.  Gait and station: Gait is normal.    DIAGNOSTIC DATA (LABS, IMAGING, TESTING) - I reviewed patient records, labs, notes, testing and imaging myself where available.  Lab Results  Component Value Date   WBC 11.5 (H) 06/22/2018   HGB 11.4 (L) 06/22/2018   HCT 34.7 (L) 06/22/2018   MCV 84.6 06/22/2018   PLT 229 06/22/2018      Component Value Date/Time   NA 134 (L) 12/12/2017 1705   K 4.2 12/12/2017 1705   CL 102 12/12/2017 1705   CO2 23 12/12/2017 1705   GLUCOSE 83 12/12/2017 1705   BUN 7 12/12/2017 1705   CREATININE 0.52 12/12/2017 1705   CALCIUM 9.2 12/12/2017 1705   PROT 6.8 03/03/2015 1917   ALBUMIN 3.8 03/03/2015 1917   AST 16 03/03/2015 1917   ALT 9 03/03/2015 1917   ALKPHOS 51 03/03/2015 1917   BILITOT 0.2 (L) 03/03/2015 1917   GFRNONAA >60 12/12/2017 1705   GFRAA >60 12/12/2017 1705   No results found for: "CHOL", "HDL", "LDLCALC", "LDLDIRECT", "TRIG", "CHOLHDL" No results found for: "HGBA1C" No results found for: "VITAMINB12" No results found for: "TSH"      No data  to display               No data to display           ASSESSMENT AND PLAN  26 y.o. year old female  has a past medical history of Medical history non-contributory and Migraine. here with    Migraine with aura and with status migrainosus, not intractable  Whitney Martin reports improvement in headaches but wishes to restart topiramate. We will continue Amovig 140mg  injections every 30 days and Nurtec as needed. I will restart topiramate 50mg  QHS per her request. I have advised against pregnancy. Side effects reviewed. She will monitor for allergy symptoms and discuss management options with PCP. She will continue healthy lifestyle habits. Follow up in 1 year.  No orders of the defined types were placed in this encounter.     Meds ordered this encounter  Medications   topiramate (TOPAMAX) 50 MG tablet    Sig: Take 1 tablet (50 mg total) by mouth daily.    Dispense:  90 tablet    Refill:  3    Order Specific Question:   Supervising Provider    Answer:   Anson Fret [[id number]]     Shawnie Dapper, MSN, FNP-C 12/01/2022, 10:28 AM  [facility] [address], [address] [place], [place] [address] [phone]

## 2022-11-27 NOTE — Patient Instructions (Signed)
Below is our plan:  We will continue Amovig 140 every 30 days. I will add in topiramate 50mg  at bedtime. Please do not become pregnant on topiramate. Continue Nurtec as needed.   Please make sure you are staying well hydrated. I recommend 50-60 ounces daily. Well balanced diet and regular exercise encouraged. Consistent sleep schedule with 6-8 hours recommended.   Please continue follow up with care team as directed.   Follow up with me in 1 year   You may receive a survey regarding today's visit. I encourage you to leave honest feed back as I do use this information to improve patient care. Thank you for seeing me today!

## 2022-12-01 ENCOUNTER — Encounter: Payer: Self-pay | Admitting: Family Medicine

## 2022-12-01 ENCOUNTER — Ambulatory Visit: Payer: Managed Care, Other (non HMO) | Admitting: Family Medicine

## 2022-12-01 VITALS — BP 115/72 | HR 78 | Ht 67.0 in | Wt 215.0 lb

## 2022-12-01 DIAGNOSIS — G43101 Migraine with aura, not intractable, with status migrainosus: Secondary | ICD-10-CM

## 2022-12-01 MED ORDER — TOPIRAMATE 50 MG PO TABS: 50.0000 mg | ORAL_TABLET | Freq: Every day | ORAL | 3 refills | Status: DC

## 2023-01-15 ENCOUNTER — Other Ambulatory Visit: Payer: Self-pay | Admitting: *Deleted

## 2023-01-15 MED ORDER — NURTEC 75 MG PO TBDP: 75.0000 mg | ORAL_TABLET | ORAL | 0 refills | Status: DC | PRN

## 2023-01-15 NOTE — Telephone Encounter (Signed)
Patient scheduled on 02/10/23 for video visit.

## 2023-02-09 NOTE — Progress Notes (Unsigned)
PATIENT: Whitney Martin DOB: 11/29/1996  REASON FOR VISIT: follow up HISTORY FROM: patient  Virtual Visit via Telephone Note  I connected with Whitney Martin on 02/10/23 at  9:00 AM EST by telephone and verified that I am speaking with the correct person using two identifiers.   I discussed the limitations, risks, security and privacy concerns of performing an evaluation and management service by telephone and the availability of in person appointments. I also discussed with the patient that there may be a patient responsible charge related to this service. The patient expressed understanding and agreed to proceed.   History of Present Illness:  02/10/23 ALL: Whitney Martin is a 27 y.o. female here today for follow up for migraines. She continues Amovig 138m every 30 days and Nurtec as needed. Topirmate 564mat bedtime was added at last visit 11/2022. Since, she reports doing well. Migraines are improved. She has had one migraine since last visit 3 months ago. She is tolerating meds. She is using Nuvaring for contraception.   12/01/22 ALL:  Whitney Martin returns for follow up for migraines. We increased Amovig to 14060mnd Ubrelvy to 100m29m last visit 07/2022. Insurance preferred [employer], she reports improvement in headache days. She may have 3-4 headaches a month. Most are mild. She is able to abort with ibuprofen. She ahs taken Nurtec three times since 07/2022. It works well. She does complain of a pressure sensation in bifrontal area regularly.    07/30/2022 ALL: Whitney Martin returns for follow up for migraines. She continues Amovig 70mg13mthly and Ubrelvy 50mg 61meeded. She reports initial improvement in headache frequency and intensity but over the past 3-4 months has noted worsening. She is having at least 12 migraine day a month. UbrelvRoselyn Meier but she usually has to take 2 doses with each migraine. She is running out of rx every month. She endorses some weight gain and  recently started on Nuvaring.    11/28/2021 ALL: Whitney LORAH26 y.o45female here today for follow up for migraines. She was advised to reduce topiramate to 50mg B41mnd start Amovig 70mg at28mt visit 09/2021. Migraines are improved. She has taken 2 injections. She is tolerating medication. Ubrelvy Roselyn Meierp with abortive therapy. She has actually weaned topiramate. She was concerned about effects on menstrual cycle and effectiveness of birth control. She has had about 2-4 headache days per month with only 1 migraine over the last 2 months.    HISTORY (copied from Dr [facility] [employer] note)   UPDATE (10/08/21, VRP): Since last visit, continues with 3-4 migraine per week. Now up to TPX 50 / 100 without benefit. Rizatriptan not helping that much. AImovig recently approved, and patient plans to start soon.    PRIOR HPI: 07/30/21: 25 year 86d female here for evaluation of headaches.  Patient has had headaches since age 51 years73ld with global severe headaches, phonophobia, neck and shoulder pain.  Headaches can last hours or days at a time.  Initially was having 2 headaches per week but now having up to 3 to 4/week.  Sometimes has nausea and sensitive to light and sound.  Sometimes has spots and sparkles in her visual fields.  Sometimes with sweating and blurred vision.  Family history of migraine in her mother.  Triggers include decreased sleep, stress and weather change.   Observations/Objective:  Generalized: Well developed, in no acute distress  Mentation: Alert oriented to time, place, history taking. Follows all commands speech and language  fluent   Assessment and Plan:  27 y.o. year old female  has a past medical history of Medical history non-contributory and Migraine. here with    ICD-10-CM   1. Migraine with aura and with status migrainosus, not intractable  G43.101       We will continue Amovig 136m injections every 30 days, topiramate 580mdaily at bedtime and Nurtec  7561maily as needed. She was advised to continue conversation with GYN regarding birth control options. Increased risk of stroke in women with migraine with aura and on estrogen based birth control discussed. Healthy lifestyle habits encouraged. She will follow up in 1 year, sooner if needed.   No orders of the defined types were placed in this encounter.   Meds ordered this encounter  Medications   Erenumab-aooe 140 MG/ML SOAJ    Sig: Inject 140 mg into the skin every 30 (thirty) days.    Dispense:  3 mL    Refill:  3    Order Specific Question:   Supervising Provider    Answer:   AHEMelvenia [id number] Sulfate (NURTEC) 75 MG TBDP    Sig: Take 1 tablet (75 mg total) by mouth as needed (Take 1 tablet at onset of migraine for abortive therapy).    Dispense:  16 tablet    Refill:  0    Order Specific Question:   Supervising Provider    Answer:   AHEMelvenia [id number]  Follow Up Instructions:  I discussed the assessment and treatment plan with the patient. The patient was provided an opportunity to ask questions and all were answered. The patient agreed with the plan and demonstrated an understanding of the instructions.   The patient was advised to call back or seek an in-person evaluation if the symptoms worsen or if the condition fails to improve as anticipated.  I provided 15 minutes of non-face-to-face time during this encounter. Patient located [place] their [place] of [place] during [place]. Provider is in the office.    AmyDebbora PrestoP

## 2023-02-09 NOTE — Patient Instructions (Signed)
Below is our plan:  We will continue Amovig monthly, topiramate 56m at bedtime and Nurtec as needed.   Migraine with aura: There is increased risk for stroke in women with migraine with aura and a contraindication for the combined contraceptive pill for use by women who have migraine with aura. The risk for women with migraine without aura is lower. However other risk factors like smoking are far more likely to increase stroke risk than migraine. There is a recommendation for no smoking and for the use of OCPs without estrogen such as progestogen only pills particularly for women with migraine with aura..Marland KitchenPeople who have migraine headaches with auras may be 3 times more likely to have a stroke caused by a blood clot, compared to migraine patients who don't see auras. Women who take hormone-replacement therapy may be 30 percent more likely to suffer a clot-based stroke than women not taking medication containing estrogen. Other risk factors like smoking and high blood pressure may be  much more important.   Magnesium: may help with headaches are aura, the best evidence for magnesium is for migraine with aura is its thought to stop the cortical spreading depression we believe is the pathophysiology of migraine aura.  To prevent or relieve headaches, try the following: Cool Compress. Lie down and place a cool compress on your head.  Avoid headache triggers. If certain foods or odors seem to have triggered your migraines in the past, avoid them. A headache diary might help you identify triggers.  Include physical activity in your daily routine. Try a daily walk or other moderate aerobic exercise.  Manage stress. Find healthy ways to cope with the stressors, such as delegating tasks on your to-do list.  Practice relaxation techniques. Try deep breathing, yoga, massage and visualization.  Eat regularly. Eating regularly scheduled meals and maintaining a healthy diet might help prevent headaches. Also, drink  plenty of fluids.  Follow a regular sleep schedule. Sleep deprivation might contribute to headaches Consider biofeedback. With this mind-body technique, you learn to control certain bodily functions -- such as muscle tension, heart rate and blood pressure -- to prevent headaches or reduce headache pain.      Proceed to emergency room if you experience new or worsening symptoms or symptoms do not resolve, if you have new neurologic symptoms or if headache is severe, or for any concerning symptom.   Please make sure you are staying well hydrated. I recommend 50-60 ounces daily. Well balanced diet and regular exercise encouraged. Consistent sleep schedule with 6-8 hours recommended.   Please continue follow up with care team as directed.   Follow up with me in 1 year   You may receive a survey regarding today's visit. I encourage you to leave honest feed back as I do use this information to improve patient care. Thank you for seeing me today!   GENERAL HEADACHE INFORMATION:   Natural supplements: Magnesium Oxide or Magnesium Glycinate 500 mg at bed (up to 800 mg daily) Coenzyme Q10 300 mg in AM Vitamin B2- 200 mg twice a day   Add 1 supplement at a time since even natural supplements can have undesirable side effects. You can sometimes buy supplements cheaper (especially Coenzyme Q10) at www.phttps://compton-perez.com/or at [employer]   Vitamins and herbs that show potential:   Magnesium: Magnesium (250 mg twice a day or 500 mg at bed) has a relaxant effect on smooth muscles such as blood vessels. Individuals suffering from frequent or daily headache usually have low  magnesium levels which can be increase with daily supplementation of 400-750 mg. Three trials found 40-90% average headache reduction  when used as a preventative. Magnesium also demonstrated the benefit in menstrually related migraine.  Magnesium is part of the messenger system in the serotonin cascade and it is a good muscle relaxant.  It is also  useful for constipation which can be a side effect of other medications used to treat migraine. Good sources include nuts, whole grains, and tomatoes. Side Effects: loose stool/diarrhea  Riboflavin (vitamin B 2) 200 mg twice a day. This vitamin assists nerve cells in the production of ATP a principal energy storing molecule.  It is necessary for many chemical reactions in the body.  There have been at least 3 clinical trials of riboflavin using 400 mg per day all of which suggested that migraine frequency can be decreased.  All 3 trials showed significant improvement in over half of migraine sufferers.  The supplement is found in bread, cereal, milk, meat, and poultry.  Most Americans get more riboflavin than the recommended daily allowance, however riboflavin deficiency is not necessary for the supplements to help prevent headache. Side effects: energizing, green urine   Coenzyme Q10: This is present in almost all cells in the body and is critical component for the conversion of energy.  Recent studies have shown that a nutritional supplement of CoQ10 can reduce the frequency of migraine attacks by improving the energy production of cells as with riboflavin.  Doses of 150 mg twice a day have been shown to be effective.   Melatonin: Increasing evidence shows correlation between melatonin secretion and headache conditions.  Melatonin supplementation has decreased headache intensity and duration.  It is widely used as a sleep aid.  Sleep is natures way of dealing with migraine.  A dose of 3 mg is recommended to start for headaches including cluster headache. Higher doses up to 15 mg has been reviewed for use in Cluster headache and have been used. The rationale behind using melatonin for cluster is that many theories regarding the cause of Cluster headache center around the disruption of the normal circadian rhythm in the brain.  This helps restore the normal circadian rhythm.   HEADACHE DIET: Foods and  beverages which may trigger migraine Note that only 20% of headache patients are food sensitive. You will know if you are food sensitive if you get a headache consistently 20 minutes to 2 hours after eating a certain food. Only cut out a food if it causes headaches, otherwise you might remove foods you enjoy! What matters most for diet is to eat a well balanced healthy diet full of vegetables and low fat protein, and to not miss meals.   Chocolate, other sweets ALL cheeses except cottage and cream cheese Dairy products, yogurt, sour cream, ice cream Liver Meat extracts (Bovril, Marmite, meat tenderizers) Meats or fish which have undergone aging, fermenting, pickling or smoking. These include: Hotdogs,salami,Lox,sausage, mortadellas,smoked salmon, pepperoni, Pickled herring Pods of broad bean (English beans, Chinese pea pods, [place] (fava) beans, lima and navy beans Ripe avocado, ripe banana Yeast extracts or active yeast preparations such as Brewer's or Fleishman's (commercial bakes goods are permitted) Tomato based foods, pizza (lasagna, etc.)   MSG (monosodium glutamate) is disguised as many things; look for these common aliases: Monopotassium glutamate Autolysed yeast Hydrolysed protein Sodium caseinate "flavorings" "all natural preservatives" Nutrasweet   Avoid all other foods that convincingly provoke headaches.   Resources: The Dizzy Lu Duffel Your Headache Diet, migrainestrong.com  https://www.aguirre.org/   Caffeine and Migraine For patients that have migraine, caffeine intake more than 3 days per week can lead to dependency and increased migraine frequency. I would recommend cutting back on your caffeine intake as best you can. The recommended amount of caffeine is 200-300 mg daily, although migraine patients may experience dependency at even lower doses. While you may notice an increase in headache temporarily, cutting back  will be helpful for headaches in the long run. For more information on caffeine and migraine, visit: https://americanmigrainefoundation.org/resource-library/caffeine-and-migraine/   Headache Prevention Strategies:   1. Maintain a headache diary; learn to identify and avoid triggers.  - This can be a simple note where you log when you had a headache, associated symptoms, and medications used - There are several smartphone apps developed to help track migraines: Migraine Buddy, Migraine Monitor, Curelator N1-Headache App   Common triggers include: Emotional triggers: Emotional/Upset family or friends Emotional/Upset occupation Business reversal/success Anticipation anxiety Crisis-serious Post-crisis periodNew job/position   Physical triggers: Vacation Day Weekend Strenuous Exercise High Altitude Location New Move Menstrual Day Physical Illness Oversleep/Not enough sleep Weather changes Light: Photophobia or light sesnitivity treatment involves a balance between desensitization and reduction in overly strong input. Use dark polarized glasses outside, but not inside. Avoid bright or fluorescent light, but do not dim environment to the point that going into a normally lit room hurts. Consider FL-41 tint lenses, which reduce the most irritating wavelengths without blocking too much light.  These can be obtained at axonoptics.com or theraspecs.com Foods: see list above.   2. Limit use of acute treatments (over-the-counter medications, triptans, etc.) to no more than 2 days per week or 10 days per month to prevent medication overuse headache (rebound headache).     3. Follow a regular schedule (including weekends and holidays): Don't skip meals. Eat a balanced diet. 8 hours of sleep nightly. Minimize stress. Exercise 30 minutes per day. Being overweight is associated with a 5 times increased risk of chronic migraine. Keep well hydrated and drink 6-8 glasses of water per day.   4.  Initiate non-pharmacologic measures at the earliest onset of your headache. Rest and quiet environment. Relax and reduce stress. Breathe2Relax is a free app that can instruct you on    some simple relaxtion and breathing techniques. Http://Dawnbuse.com is a    free website that provides teaching videos on relaxation.  Also, there are  many apps that   can be downloaded for "mindful" relaxation.  An app called YOGA NIDRA will help walk you through mindfulness. Another app called Calm can be downloaded to give you a structured mindfulness guide with daily reminders and skill development. Headspace for guided meditation Mindfulness Based Stress Reduction Online Course: www.palousemindfulness.com Cold compresses.   5. Don't wait!! Take the maximum allowable dosage of prescribed medication at the first sign of migraine.   6. Compliance:  Take prescribed medication regularly as directed and at the first sign of a migraine.   7. Communicate:  Call your physician when problems arise, especially if your headaches change, increase in frequency/severity, or become associated with neurological symptoms (weakness, numbness, slurred speech, etc.).   8. Headache/pain management therapies: Consider various complementary methods, including medication, behavioral therapy, psychological counselling, biofeedback, massage therapy, acupuncture, dry needling, and other modalities.  Such measures may reduce the need for medications. Counseling for pain management, where patients learn to function and ignore/minimize their pain, seems to work very well.   9. Recommend changing family's attention and focus away from patient's  headaches. Instead, emphasize daily activities. If first question of day is 'How are your headaches/Do you have a headache today?', then patient will constantly think about headaches, thus making them worse. Goal is to re-direct attention away from headaches, toward daily activities and other  distractions.   10. Helpful Websites: www.AmericanHeadacheSociety.org VoipObserver.it www.headaches.org GolfingFamily.no www.achenet.org

## 2023-02-10 ENCOUNTER — Encounter: Payer: Self-pay | Admitting: Family Medicine

## 2023-02-10 ENCOUNTER — Telehealth (INDEPENDENT_AMBULATORY_CARE_PROVIDER_SITE_OTHER): Payer: Managed Care, Other (non HMO) | Admitting: Family Medicine

## 2023-02-10 DIAGNOSIS — G43101 Migraine with aura, not intractable, with status migrainosus: Secondary | ICD-10-CM | POA: Diagnosis not present

## 2023-02-10 MED ORDER — ERENUMAB-AOOE 140 MG/ML ~~LOC~~ SOAJ: 140.0000 mg | SUBCUTANEOUS | 3 refills | Status: DC

## 2023-02-10 MED ORDER — NURTEC 75 MG PO TBDP: 75.0000 mg | ORAL_TABLET | ORAL | 0 refills | Status: DC | PRN

## 2023-02-19 ENCOUNTER — Encounter: Payer: Self-pay | Admitting: Family Medicine

## 2023-02-19 NOTE — Telephone Encounter (Addendum)
PA Aimovig submitted urgently on covermymeds. Key: [id number]. Waiting on determination from [facility].  "Request Reference Number: [id number]. AIMOVIG INJ '140MG'$ /ML is approved through 02/19/2024. Your patient may now fill this prescription and it will be covered."

## 2023-02-19 NOTE — Telephone Encounter (Addendum)
PA Nurtec submitted urgently on covermymeds. Key: [id number]. Waiting on determination from [facility].  "This medication or product was previously approved on [id number] from 2022-10-06 to 2023-10-07. You will be able to fill a prescription for this medication at your pharmacy. If your pharmacy has questions regarding the processing of your prescription, please have them call the [facility] pharmacy help desk at ([phone]."

## 2023-03-14 ENCOUNTER — Other Ambulatory Visit: Payer: Self-pay | Admitting: Family Medicine

## 2023-03-16 MED ORDER — NURTEC 75 MG PO TBDP: 75.0000 mg | ORAL_TABLET | ORAL | 1 refills | Status: DC | PRN

## 2023-03-16 NOTE — Telephone Encounter (Signed)
Video visit was on 02/10/23 Follow up scheduled on 12/02/23

## 2023-09-02 IMAGING — US US ABDOMEN LIMITED
1 series · 14 of 25 positions shown · non-contrast
Comparison: October 17, 2019.

CLINICAL DATA: Right upper quadrant abdominal pain.

EXAM:
ULTRASOUND ABDOMEN LIMITED RIGHT UPPER QUADRANT

[Series 1: us abdomen limited · 0.25mm/px · 14 of 43 slices shown]
[im 1/43]
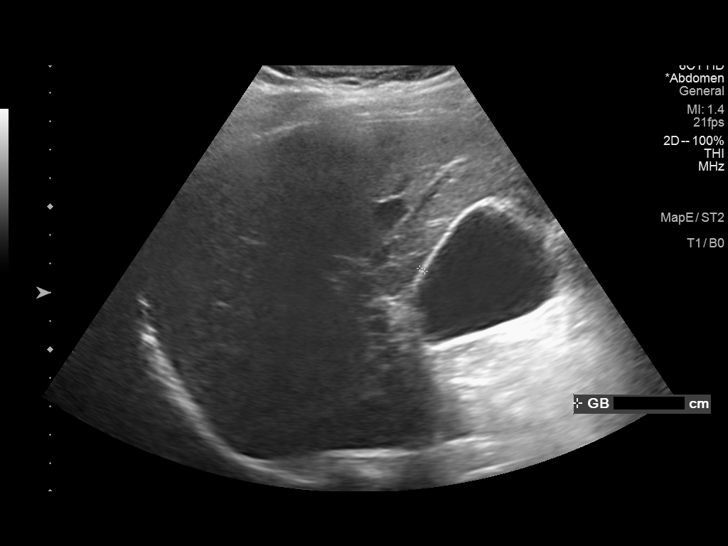
[im 4/43]
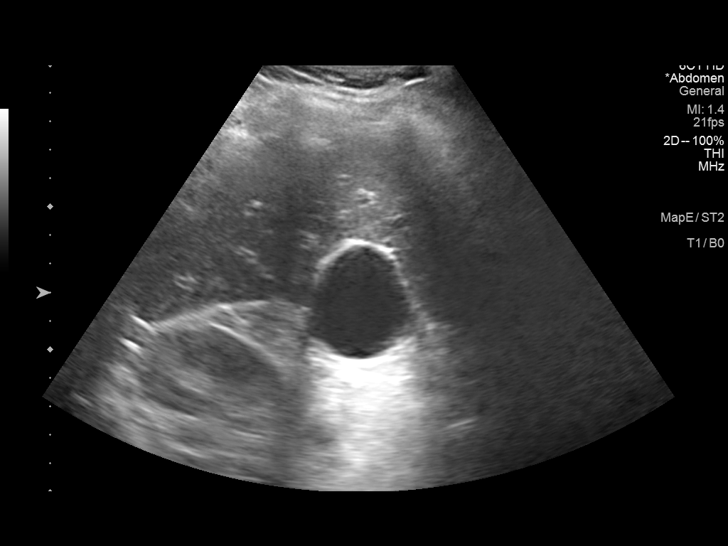
[im 8/43]
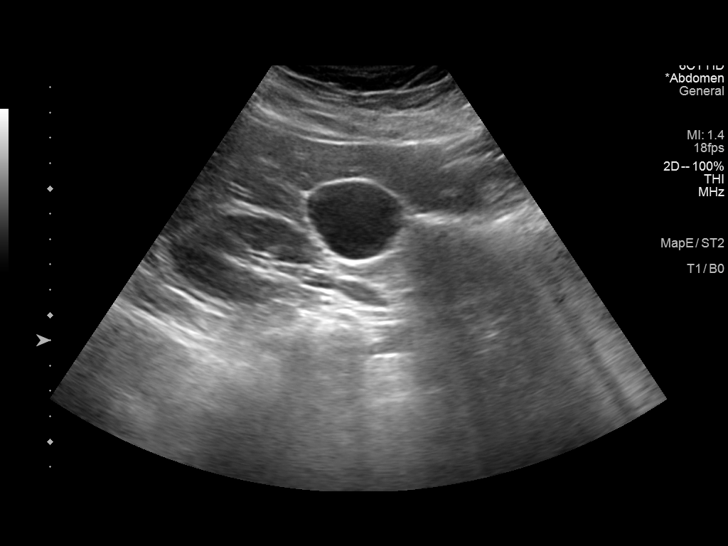
[im 11/43]
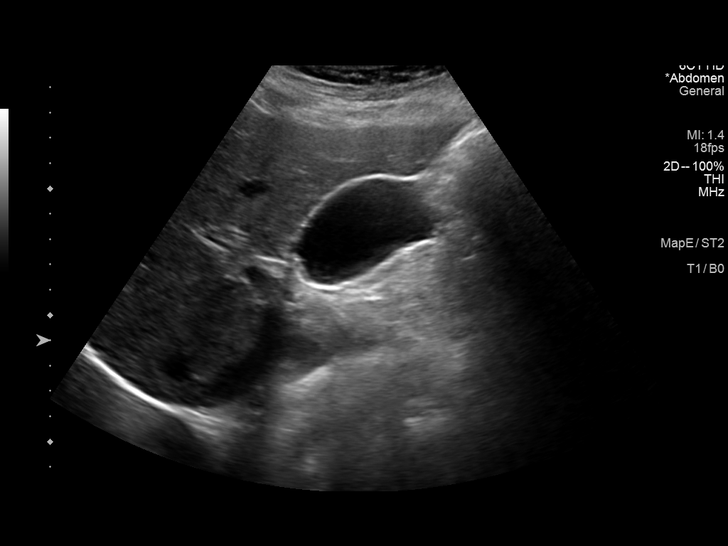
[im 15/43]
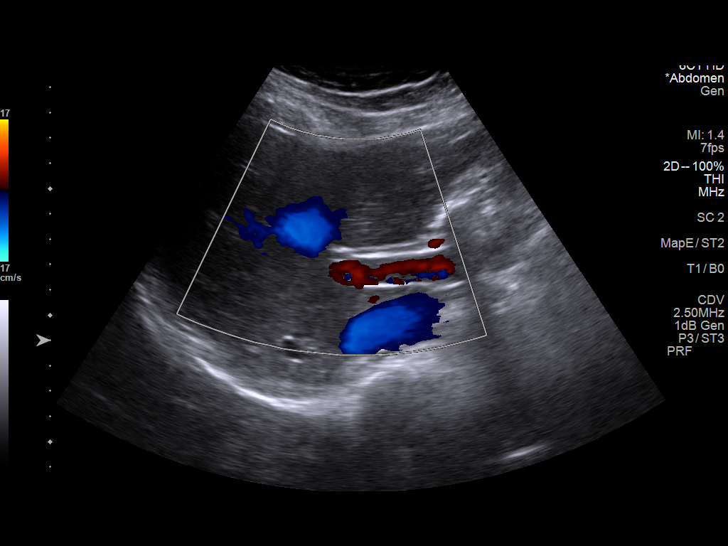
[im 16/43]
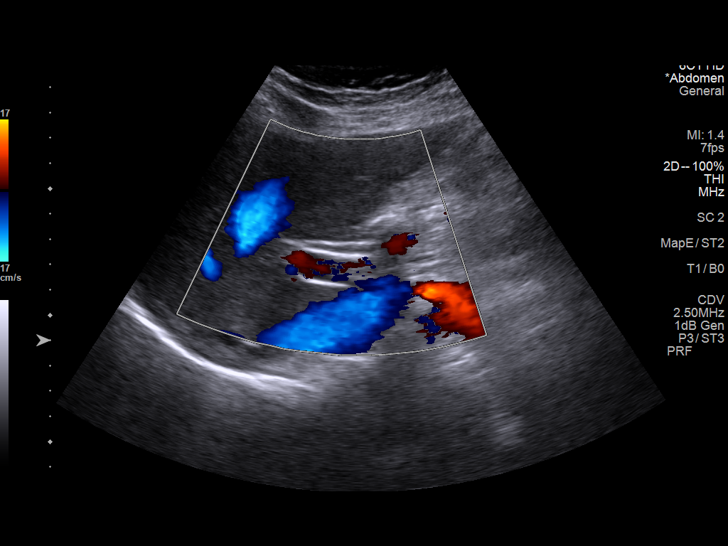
[im 20/43]
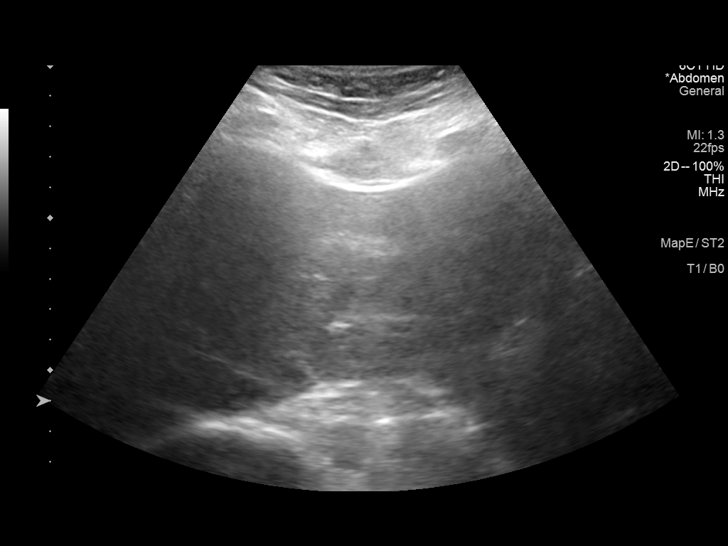
[im 23/43]
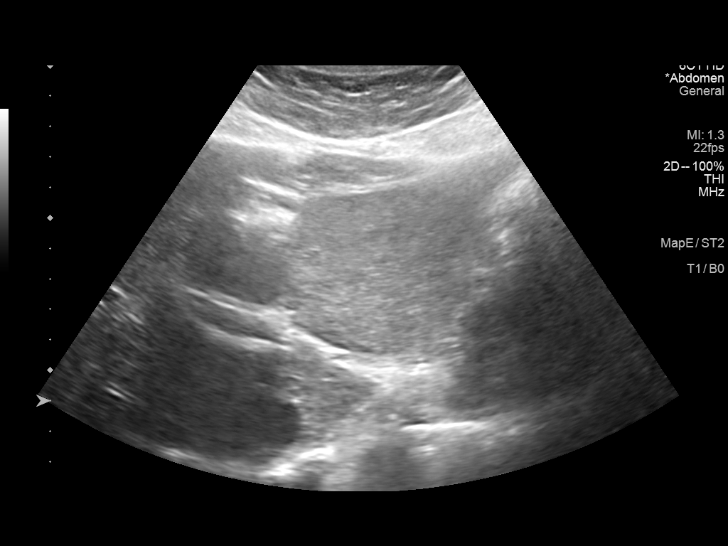
[im 27/43]
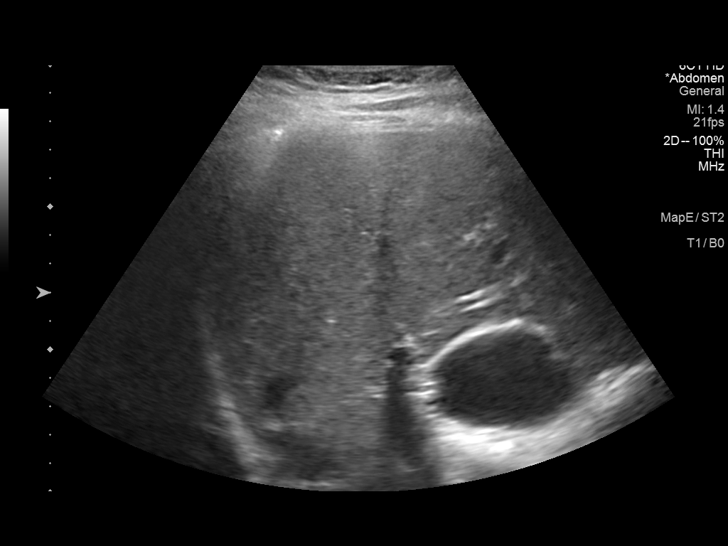
[im 29/43]
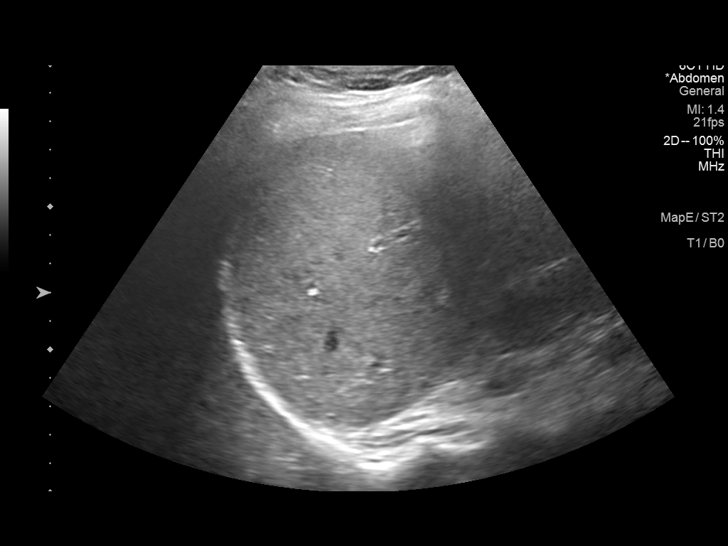
[im 32/43]
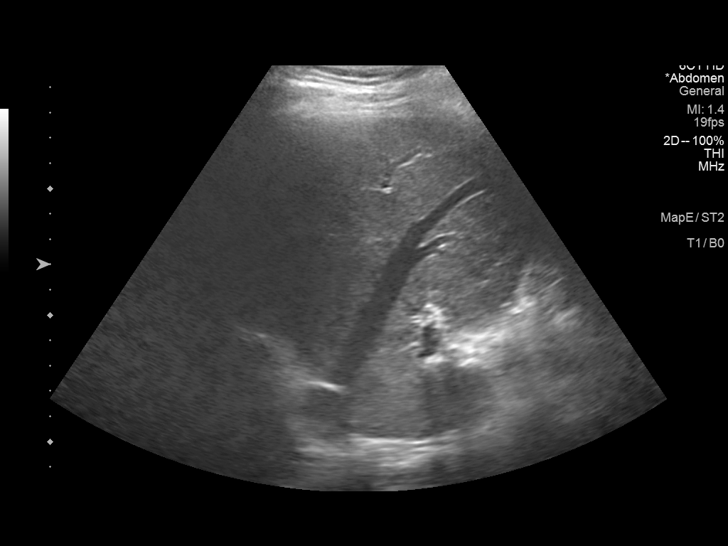
[im 36/43]
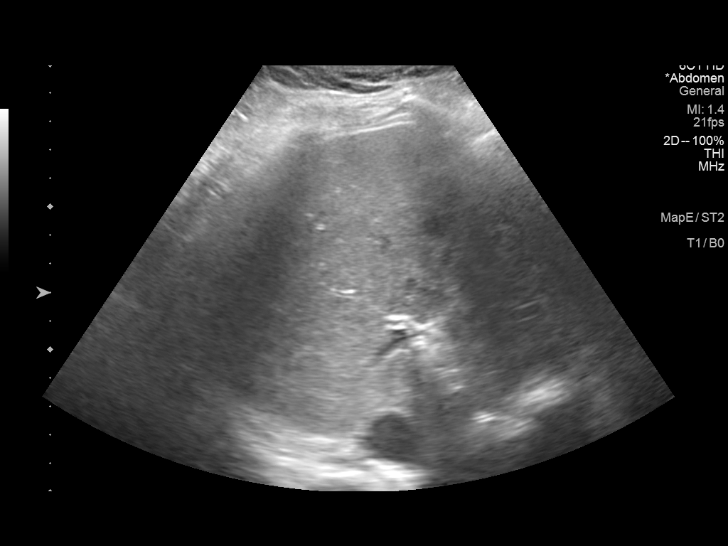
[im 39/43]
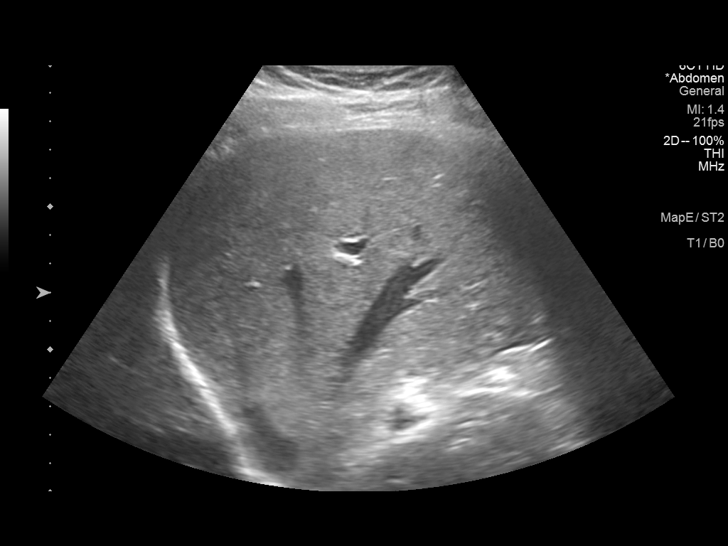
[im 43/43]
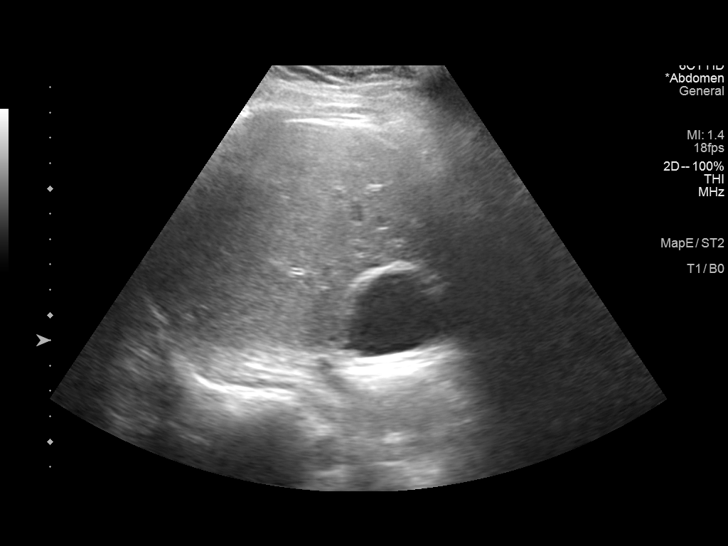

[14 of 25 positions shown; findings below may reference images not displayed]

FINDINGS: Gallbladder:

No gallstones or wall thickening visualized. No sonographic Murphy
sign noted by sonographer.

Common bile duct:

Diameter: 2 mm which is within normal limits.

Liver:

No focal lesion identified. Within normal limits in parenchymal
echogenicity. Portal vein is patent on color Doppler imaging with
normal direction of blood flow towards the liver.

Other: None.
IMPRESSION: No definite abnormality seen in the right upper quadrant of the
abdomen.

## 2023-09-28 ENCOUNTER — Other Ambulatory Visit (HOSPITAL_COMMUNITY): Payer: Self-pay

## 2023-09-28 ENCOUNTER — Telehealth: Payer: Self-pay | Admitting: Pharmacy Technician

## 2023-09-28 NOTE — Telephone Encounter (Signed)
Pharmacy Patient Advocate Encounter   Received notification from CoverMyMeds that prior authorization for Nurtec 75MG  dispersible tablets is required/requested.   Insurance verification completed.   The patient is insured through [facility] .   Per test claim: PA required; PA submitted to [facility] via CoverMyMeds Key/confirmation #/EOC [id number] Status is pending

## 2023-10-02 NOTE — Telephone Encounter (Signed)
Pharmacy Patient Advocate Encounter  Received notification from [facility] that Prior Authorization for Nurtec 75MG  dispersible tablets has been APPROVED from 09/28/2023 to 09/27/2024   PA #/Case ID/Reference #: PA Case ID #: [id number]

## 2023-12-01 ENCOUNTER — Telehealth: Payer: Self-pay

## 2023-12-01 NOTE — Patient Instructions (Incomplete)
Below is our plan:  We will continue Amovig and Nurtec   Please make sure you are staying well hydrated. I recommend 50-60 ounces daily. Well balanced diet and regular exercise encouraged. Consistent sleep schedule with 6-8 hours recommended.   Please continue follow up with care team as directed.   Follow up with me in 1 year   You may receive a survey regarding today's visit. I encourage you to leave honest feed back as I do use this information to improve patient care. Thank you for seeing me today!   GENERAL HEADACHE INFORMATION:   Natural supplements: Magnesium Oxide or Magnesium Glycinate 500 mg at bed (up to 800 mg daily) Coenzyme Q10 300 mg in AM Vitamin B2- 200 mg twice a day   Add 1 supplement at a time since even natural supplements can have undesirable side effects. You can sometimes buy supplements cheaper (especially Coenzyme Q10) at www.WebmailGuide.co.za or at [facility].  Migraine with aura: There is increased risk for stroke in women with migraine with aura and a contraindication for the combined contraceptive pill for use by women who have migraine with aura. The risk for women with migraine without aura is lower. However other risk factors like smoking are far more likely to increase stroke risk than migraine. There is a recommendation for no smoking and for the use of OCPs without estrogen such as progestogen only pills particularly for women with migraine with aura.Marland Kitchen People who have migraine headaches with auras may be 3 times more likely to have a stroke caused by a blood clot, compared to migraine patients who don't see auras. Women who take hormone-replacement therapy may be 30 percent more likely to suffer a clot-based stroke than women not taking medication containing estrogen. Other risk factors like smoking and high blood pressure may be  much more important.    Vitamins and herbs that show potential:   Magnesium: Magnesium (250 mg twice a day or 500 mg at bed) has a  relaxant effect on smooth muscles such as blood vessels. Individuals suffering from frequent or daily headache usually have low magnesium levels which can be increase with daily supplementation of 400-750 mg. Three trials found 40-90% average headache reduction  when used as a preventative. Magnesium may help with headaches are aura, the best evidence for magnesium is for migraine with aura is its thought to stop the cortical spreading depression we believe is the pathophysiology of migraine aura.Magnesium also demonstrated the benefit in menstrually related migraine.  Magnesium is part of the messenger system in the serotonin cascade and it is a good muscle relaxant.  It is also useful for constipation which can be a side effect of other medications used to treat migraine. Good sources include nuts, whole grains, and tomatoes. Side Effects: loose stool/diarrhea  Riboflavin (vitamin B 2) 200 mg twice a day. This vitamin assists nerve cells in the production of ATP a principal energy storing molecule.  It is necessary for many chemical reactions in the body.  There have been at least 3 clinical trials of riboflavin using 400 mg per day all of which suggested that migraine frequency can be decreased.  All 3 trials showed significant improvement in over half of migraine sufferers.  The supplement is found in bread, cereal, milk, meat, and poultry.  Most Americans get more riboflavin than the recommended daily allowance, however riboflavin deficiency is not necessary for the supplements to help prevent headache. Side effects: energizing, green urine   Coenzyme Q10: This is  present in almost all cells in the body and is critical component for the conversion of energy.  Recent studies have shown that a nutritional supplement of CoQ10 can reduce the frequency of migraine attacks by improving the energy production of cells as with riboflavin.  Doses of 150 mg twice a day have been shown to be effective.   Melatonin:  Increasing evidence shows correlation between melatonin secretion and headache conditions.  Melatonin supplementation has decreased headache intensity and duration.  It is widely used as a sleep aid.  Sleep is natures way of dealing with migraine.  A dose of 3 mg is recommended to start for headaches including cluster headache. Higher doses up to 15 mg has been reviewed for use in Cluster headache and have been used. The rationale behind using melatonin for cluster is that many theories regarding the cause of Cluster headache center around the disruption of the normal circadian rhythm in the brain.  This helps restore the normal circadian rhythm.   HEADACHE DIET: Foods and beverages which may trigger migraine Note that only 20% of headache patients are food sensitive. You will know if you are food sensitive if you get a headache consistently 20 minutes to 2 hours after eating a certain food. Only cut out a food if it causes headaches, otherwise you might remove foods you enjoy! What matters most for diet is to eat a well balanced healthy diet full of vegetables and low fat protein, and to not miss meals.   Chocolate, other sweets ALL cheeses except cottage and cream cheese Dairy products, yogurt, sour cream, ice cream Liver Meat extracts (Bovril, Marmite, meat tenderizers) Meats or fish which have undergone aging, fermenting, pickling or smoking. These include: Hotdogs,salami,Lox,sausage, mortadellas,smoked salmon, pepperoni, Pickled herring Pods of broad bean (English beans, Chinese pea pods, [place] (fava) beans, lima and navy beans Ripe avocado, ripe banana Yeast extracts or active yeast preparations such as Brewer's or Fleishman's (commercial bakes goods are permitted) Tomato based foods, pizza (lasagna, etc.)   MSG (monosodium glutamate) is disguised as many things; look for these common aliases: Monopotassium glutamate Autolysed yeast Hydrolysed protein Sodium  caseinate "flavorings" "all natural preservatives" Nutrasweet   Avoid all other foods that convincingly provoke headaches.   Resources: The Dizzy [place]air Laundry Your Headache Diet, migrainestrong.com  https://zamora-andrews.com/   Caffeine and Migraine For patients that have migraine, caffeine intake more than 3 days per week can lead to dependency and increased migraine frequency. I would recommend cutting back on your caffeine intake as best you can. The recommended amount of caffeine is 200-300 mg daily, although migraine patients may experience dependency at even lower doses. While you may notice an increase in headache temporarily, cutting back will be helpful for headaches in the long run. For more information on caffeine and migraine, visit: https://americanmigrainefoundation.org/resource-library/caffeine-and-migraine/   Headache Prevention Strategies:   1. Maintain a headache diary; learn to identify and avoid triggers.  - This can be a simple note where you log when you had a headache, associated symptoms, and medications used - There are several smartphone apps developed to help track migraines: Migraine Buddy, Migraine Monitor, Curelator N1-Headache App   Common triggers include: Emotional triggers: Emotional/Upset family or friends Emotional/Upset occupation Business reversal/success Anticipation anxiety Crisis-serious Post-crisis periodNew job/position   Physical triggers: Vacation Day Weekend Strenuous Exercise High Altitude Location New Move Menstrual Day Physical Illness Oversleep/Not enough sleep Weather changes Light: Photophobia or light sesnitivity treatment involves a balance between desensitization and reduction in overly strong input.  Use dark polarized glasses outside, but not inside. Avoid bright or fluorescent light, but do not dim environment to the point that going into a normally lit room hurts. Consider  FL-41 tint lenses, which reduce the most irritating wavelengths without blocking too much light.  These can be obtained at axonoptics.com or theraspecs.com Foods: see list above.   2. Limit use of acute treatments (over-the-counter medications, triptans, etc.) to no more than 2 days per week or 10 days per month to prevent medication overuse headache (rebound headache).     3. Follow a regular schedule (including weekends and holidays): Don't skip meals. Eat a balanced diet. 8 hours of sleep nightly. Minimize stress. Exercise 30 minutes per day. Being overweight is associated with a 5 times increased risk of chronic migraine. Keep well hydrated and drink 6-8 glasses of water per day.   4. Initiate non-pharmacologic measures at the earliest onset of your headache. Rest and quiet environment. Relax and reduce stress. Breathe2Relax is a free app that can instruct you on    some simple relaxtion and breathing techniques. Http://Dawnbuse.com is a    free website that provides teaching videos on relaxation.  Also, there are  many apps that   can be downloaded for "mindful" relaxation.  An app called YOGA NIDRA will help walk you through mindfulness. Another app called Calm can be downloaded to give you a structured mindfulness guide with daily reminders and skill development. Headspace for guided meditation Mindfulness Based Stress Reduction Online Course: www.palousemindfulness.com Cold compresses.   5. Don't wait!! Take the maximum allowable dosage of prescribed medication at the first sign of migraine.   6. Compliance:  Take prescribed medication regularly as directed and at the first sign of a migraine.   7. Communicate:  Call your physician when problems arise, especially if your headaches change, increase in frequency/severity, or become associated with neurological symptoms (weakness, numbness, slurred speech, etc.). Proceed to emergency room if you experience new or worsening symptoms or  symptoms do not resolve, if you have new neurologic symptoms or if headache is severe, or for any concerning symptom.   8. Headache/pain management therapies: Consider various complementary methods, including medication, behavioral therapy, psychological counselling, biofeedback, massage therapy, acupuncture, dry needling, and other modalities.  Such measures may reduce the need for medications. Counseling for pain management, where patients learn to function and ignore/minimize their pain, seems to work very well.   9. Recommend changing family's attention and focus away from patient's headaches. Instead, emphasize daily activities. If first question of day is 'How are your headaches/Do you have a headache today?', then patient will constantly think about headaches, thus making them worse. Goal is to re-direct attention away from headaches, toward daily activities and other distractions.   10. Helpful Websites: www.AmericanHeadacheSociety.org PatentHood.ch www.headaches.org TightMarket.nl www.achenet.org

## 2023-12-01 NOTE — Progress Notes (Unsigned)
No chief complaint on file.   HISTORY OF PRESENT ILLNESS:  12/01/23 ALL:  Whitney Martin returns for follow up for migraines. She continues Amovig and Nurtec.   02/10/23 ALL (Mychart): Whitney Martin is a 27 y.o. female here today for follow up for migraines. She continues Amovig 140mg  every 30 days and Nurtec as needed. Topirmate 50mg  at bedtime was added at last visit 11/2022. Since, she reports doing well. Migraines are improved. She has had one migraine since last visit 3 months ago. She is tolerating meds. She is using Nuvaring for contraception.   12/01/2022 ALL:  Whitney Martin returns for follow up for migraines. We increased Amovig to 140mg  and Ubrelvy to 100mg  at last visit 07/2022. Insurance preferred [employer]	[place]. Since, she reports improvement in headache days. She may have 3-4 headaches a month. Most are mild. She is able to abort with ibuprofen. She ahs taken Nurtec three times since 07/2022. It works well. She does complain of a pressure sensation in bifrontal area regularly.   07/30/2022 ALL: Whitney Martin returns for follow up for migraines. She continues Amovig 70mg  monthly and Ubrelvy 50mg  as needed. She reports initial improvement in headache frequency and intensity but over the past 3-4 months has noted worsening. She is having at least 12 migraine day a month. Bernita Raisin helps but she usually has to take 2 doses with each migraine. She is running out of rx every month. She endorses some weight gain and recently started on Nuvaring.   11/28/2021 ALL: Whitney Martin is a 27 y.o. female here today for follow up for migraines. She was advised to reduce topiramate to 50mg  BID and start Amovig 70mg  at last visit 09/2021. Migraines are improved. She has taken 2 injections. She is tolerating medication. Bernita Raisin did help with abortive therapy. She has actually weaned topiramate. She was concerned about effects on menstrual cycle and effectiveness of birth control. She has had about 2-4 headache days per  month with only 1 migraine over the last 2 months.   HISTORY (copied from Dr Richrd Humbles previous note)  UPDATE (10/08/21, VRP): Since last visit, continues with 3-4 migraine per week. Now up to TPX 50 / 100 without benefit. Rizatriptan not helping that much. AImovig recently approved, and patient plans to start soon.    PRIOR HPI: 07/30/21: 27 year old female here for evaluation of headaches.  Patient has had headaches since age 27 years old with global severe headaches, phonophobia, neck and shoulder pain.  Headaches can last hours or days at a time.  Initially was having 2 headaches per week but now having up to 3 to 4/week.  Sometimes has nausea and sensitive to light and sound.  Sometimes has spots and sparkles in her visual fields.  Sometimes with sweating and blurred vision.  Family history of migraine in her mother.  Triggers include decreased sleep, stress and weather change.   REVIEW OF SYSTEMS: Out of a complete 14 system review of symptoms, the patient complains only of the following symptoms, headaches, weight gain and all other reviewed systems are negative.   ALLERGIES: No Known Allergies   HOME MEDICATIONS:     PAST MEDICAL HISTORY: Past Medical History:  Diagnosis Date   Medical history non-contributory    Migraine      PAST SURGICAL HISTORY: Past Surgical History:  Procedure Laterality Date   WISDOM TOOTH EXTRACTION       FAMILY HISTORY: Family History  Problem Relation Age of Onset   Migraines Mother    Healthy Mother  Healthy Father    Migraines Maternal Uncle      SOCIAL HISTORY: Social History   Socioeconomic History   Marital status: Significant Other    Spouse name: Not on file   Number of children: 2   Years of education: Not on file   Highest education level: High school graduate  Occupational History   Not on file  Tobacco Use   Smoking status: Never   Smokeless tobacco: Never  Substance and Sexual Activity   Alcohol use: Yes     Comment: not often   Drug use: No   Sexual activity: Yes    Birth control/protection: Pill, None  Other Topics Concern   Not on file  Social History Narrative   Lives with children   Caffeine, maybe 1 x week   Social Determinants of Health   Financial Resource Strain: Not on file  Food Insecurity: Not on file  Transportation Needs: Not on file  Physical Activity: Not on file  Stress: Not on file  Social Connections: Unknown (05/05/2022)   Received from [facility], [facility]   Social Network    Social Network: Not on file  Intimate Partner Violence: Unknown (03/26/2022)   Received from [facility], [facility]   HITS    Physically Hurt: Not on file    Insult or Talk Down To: Not on file    Threaten Physical Harm: Not on file    Scream or Curse: Not on file     PHYSICAL EXAM  There were no vitals filed for this visit.    There is no height or weight on file to calculate BMI.  Generalized: Well developed, in no acute distress  Cardiology: normal rate and rhythm, no murmur auscultated  Respiratory: clear to auscultation bilaterally    Neurological examination  Mentation: Alert oriented to time, place, history taking. Follows all commands speech and language fluent Cranial nerve II-XII: Pupils were equal round reactive to light. Extraocular movements were full, visual field were full on confrontational test. Facial sensation and strength were normal. Head turning and shoulder shrug  were normal and symmetric. Motor: The motor testing reveals 5 over 5 strength of all 4 extremities. Good symmetric motor tone is noted throughout.  Gait and station: Gait is normal.    DIAGNOSTIC DATA (LABS, IMAGING, TESTING) - I reviewed patient records, labs, notes, testing and imaging myself where available.  Lab Results  Component Value Date   WBC 11.5 (H) 06/22/2018   HGB 11.4 (L) 06/22/2018   HCT 34.7 (L) 06/22/2018   MCV 84.6 06/22/2018   PLT 229 06/22/2018       Component Value Date/Time   NA 134 (L) 12/12/2017 1705   K 4.2 12/12/2017 1705   CL 102 12/12/2017 1705   CO2 23 12/12/2017 1705   GLUCOSE 83 12/12/2017 1705   BUN 7 12/12/2017 1705   CREATININE 0.52 12/12/2017 1705   CALCIUM 9.2 12/12/2017 1705   PROT 6.8 03/03/2015 1917   ALBUMIN 3.8 03/03/2015 1917   AST 16 03/03/2015 1917   ALT 9 03/03/2015 1917   ALKPHOS 51 03/03/2015 1917   BILITOT 0.2 (L) 03/03/2015 1917   GFRNONAA >60 12/12/2017 1705   GFRAA >60 12/12/2017 1705   No results found for: "CHOL", "HDL", "LDLCALC", "LDLDIRECT", "TRIG", "CHOLHDL" No results found for: "HGBA1C" No results found for: "VITAMINB12" No results found for: "TSH"      No data to display  No data to display           ASSESSMENT AND PLAN  27 y.o. year old female  has a past medical history of Medical history non-contributory and Migraine. here with    No diagnosis found.  Meadow reports improvement in headaches but wishes to restart topiramate. We will continue Amovig 140mg  injections every 30 days and Nurtec as needed. I will restart topiramate 50mg  QHS per her request. I have advised against pregnancy. Side effects reviewed. She will monitor for allergy symptoms and discuss management options with PCP. She will continue healthy lifestyle habits. Follow up in 1 year.   No orders of the defined types were placed in this encounter.     No orders of the defined types were placed in this encounter.    Shawnie Dapper, MSN, FNP-C 12/01/2023, 1:32 PM  [facility] [address], [address] [place], [place] [address] [phone]

## 2023-12-01 NOTE — Telephone Encounter (Signed)
Patient left a voicemail this afternoon asking for a call to reschedule her appointment tomorrow.

## 2023-12-01 NOTE — Telephone Encounter (Signed)
Called pt. Rescheduled her to 12/22/23 at 10am w/ Amy. She is stable/no new sx.

## 2023-12-02 ENCOUNTER — Ambulatory Visit: Payer: Managed Care, Other (non HMO) | Admitting: Family Medicine

## 2023-12-08 ENCOUNTER — Other Ambulatory Visit: Payer: Self-pay

## 2023-12-08 MED ORDER — TOPIRAMATE 50 MG PO TABS: 50.0000 mg | ORAL_TABLET | Freq: Every day | ORAL | 0 refills | Status: DC

## 2023-12-21 NOTE — Progress Notes (Addendum)
 PATIENT: Whitney Martin DOB: 03/23/1996  REASON FOR VISIT: follow up HISTORY FROM: patient  Virtual Visit via Telephone Note  I connected with Whitney Martin on 12/22/23 at 10:00 AM EST by telephone and verified that I am speaking with the correct person using two identifiers.   I discussed the limitations, risks, security and privacy concerns of performing an evaluation and management service by telephone and the availability of in person appointments. I also discussed with the patient that there may be a patient responsible charge related to this service. The patient expressed understanding and agreed to proceed.   History of Present Illness:  12/22/23 ALL (Mychart): Whitney Martin returns for follow up for migraines. She as last seen 01/2023 and doing well on Amovig, topiramate  and Nurtec. Since, she reports doing really well until the past couple of weeks. She had only needed Nurtec 4 times over the past 6 months but has had about 3-4 headache days a week in December. Nurtec not working as well. No obvious triggers but she feels weather changes may contribute.   02/10/2023 ALL:  Whitney Martin is a 27 y.o. female here today for follow up for migraines. She continues Amovig 140mg  every 30 days and Nurtec as needed. Topirmate 50mg  at bedtime was added at last visit 11/2022. Since, she reports doing well. Migraines are improved. She has had one migraine since last visit 3 months ago. She is tolerating meds. She is using Nuvaring for contraception.   12/01/22 ALL:  Whitney Martin returns for follow up for migraines. We increased Amovig to 140mg  and Ubrelvy  to 100mg  at last visit 07/2022. Insurance preferred Nurtec. Since, she reports improvement in headache days. She may have 3-4 headaches a month. Most are mild. She is able to abort with ibuprofen . She ahs taken Nurtec three times since 07/2022. It works well. She does complain of a pressure sensation in bifrontal area regularly.    07/30/2022  ALL: Whitney Martin returns for follow up for migraines. She continues Amovig 70mg  monthly and Ubrelvy  50mg  as needed. She reports initial improvement in headache frequency and intensity but over the past 3-4 months has noted worsening. She is having at least 12 migraine day a month. Ubrelvy  helps but she usually has to take 2 doses with each migraine. She is running out of rx every month. She endorses some weight gain and recently started on Nuvaring.    11/28/2021 ALL: Whitney Martin is a 27 y.o. female here today for follow up for migraines. She was advised to reduce topiramate  to 50mg  BID and start Amovig 70mg  at last visit 09/2021. Migraines are improved. She has taken 2 injections. She is tolerating medication. Ubrelvy  did help with abortive therapy. She has actually weaned topiramate . She was concerned about effects on menstrual cycle and effectiveness of birth control. She has had about 2-4 headache days per month with only 1 migraine over the last 2 months.    HISTORY (copied from Dr Chancy previous note)   UPDATE (10/08/21, VRP): Since last visit, continues with 3-4 migraine per week. Now up to TPX 50 / 100 without benefit. Rizatriptan  not helping that much. AImovig  recently approved, and patient plans to start soon.    PRIOR HPI: 07/30/21: 27 year old female here for evaluation of headaches.  Patient has had headaches since age 62 years old with global severe headaches, phonophobia, neck and shoulder pain.  Headaches can last hours or days at a time.  Initially was having 2 headaches per week but now having  up to 3 to 4/week.  Sometimes has nausea and sensitive to light and sound.  Sometimes has spots and sparkles in her visual fields.  Sometimes with sweating and blurred vision.  Family history of migraine in her mother.  Triggers include decreased sleep, stress and weather change.   Observations/Objective:  Generalized: Well developed, in no acute distress  Mentation: Alert oriented to  time, place, history taking. Follows all commands speech and language fluent   Assessment and Plan:  27 y.o. year old female  has a past medical history of Medical history non-contributory and Migraine. here with    ICD-10-CM   1. Migraine with aura and with status migrainosus, not intractable  G43.101       We will continue Amovig 140mg  injections every 30 days, topiramate  50mg  daily at bedtime and Nurtec 75mg  daily as needed. May increase topiramate  to 75mg  nightly for 1 week if needed. Can increase dose in future if needed. May try taking Excedrin with Nurtec for intractable headaches. Using Nuvaring for contraception. Healthy lifestyle habits encouraged. She will follow up in 1 year, sooner if needed.    Addendum: Patient reports Nurtec has worked better over the past 8 months and wishes to continue for abortive therapy.   No orders of the defined types were placed in this encounter.   Meds ordered this encounter  Medications   Erenumab -aooe 140 MG/ML SOAJ    Sig: Inject 140 mg into the skin every 30 (thirty) days.    Dispense:  3 mL    Refill:  3    Supervising Provider:   AHERN, ANTONIA B [[id number]]   topiramate  (TOPAMAX ) 50 MG tablet    Sig: Take 1 tablet (50 mg total) by mouth daily.    Dispense:  90 tablet    Refill:  3    Supervising Provider:   AHERN, ANTONIA B [[id number]]   Rimegepant Sulfate (NURTEC) 75 MG TBDP    Sig: Take 1 tablet (75 mg total) by mouth as needed (Take 1 tablet at onset of migraine for abortive therapy).    Dispense:  16 tablet    Refill:  1    Supervising Provider:   INES ONETHA NOVAK [[id number]]     Follow Up Instructions:  I discussed the assessment and treatment plan with the patient. The patient was provided an opportunity to ask questions and all were answered. The patient agreed with the plan and demonstrated an understanding of the instructions.   The patient was advised to call back or seek an in-person evaluation if the symptoms worsen  or if the condition fails to improve as anticipated.  I provided 15 minutes of non-face-to-face time during this encounter. Patient located at their place of residence during Mychart visit. Provider is in the office.    Anitha Kreiser, NP

## 2023-12-21 NOTE — Patient Instructions (Addendum)
 Below is our plan:  We will continue Amovig, topiramate  and Nurtec as prescribed. Try increasing topiramate  to 75mg  for 1 week to see if headaches settle down. We can increase dose in future if needed. Try taking Nurtec with Excedrin for intractable headaches. Do not become pregnant on topiramate  or Amovig.   Please make sure you are staying well hydrated. I recommend 50-60 ounces daily. Well balanced diet and regular exercise encouraged. Consistent sleep schedule with 6-8 hours recommended.   Please continue follow up with care team as directed.   Follow up with me in 1 year  You may receive a survey regarding today's visit. I encourage you to leave honest feed back as I do use this information to improve patient care. Thank you for seeing me today!   GENERAL HEADACHE INFORMATION:   Natural supplements: Magnesium Oxide or Magnesium Glycinate 500 mg at bed (up to 800 mg daily) Coenzyme Q10 300 mg in AM Vitamin B2- 200 mg twice a day   Add 1 supplement at a time since even natural supplements can have undesirable side effects. You can sometimes buy supplements cheaper (especially Coenzyme Q10) at www.webmailguide.co.za or at [facility].  Migraine with aura: There is increased risk for stroke in women with migraine with aura and a contraindication for the combined contraceptive pill for use by women who have migraine with aura. The risk for women with migraine without aura is lower. However other risk factors like smoking are far more likely to increase stroke risk than migraine. There is a recommendation for no smoking and for the use of OCPs without estrogen such as progestogen only pills particularly for women with migraine with aura.SABRA People who have migraine headaches with auras may be 3 times more likely to have a stroke caused by a blood clot, compared to migraine patients who don't see auras. Women who take hormone-replacement therapy may be 30 percent more likely to suffer a clot-based stroke than  women not taking medication containing estrogen. Other risk factors like smoking and high blood pressure may be  much more important.    Vitamins and herbs that show potential:   Magnesium: Magnesium (250 mg twice a day or 500 mg at bed) has a relaxant effect on smooth muscles such as blood vessels. Individuals suffering from frequent or daily headache usually have low magnesium levels which can be increase with daily supplementation of 400-750 mg. Three trials found 40-90% average headache reduction  when used as a preventative. Magnesium may help with headaches are aura, the best evidence for magnesium is for migraine with aura is its thought to stop the cortical spreading depression we believe is the pathophysiology of migraine aura.Magnesium also demonstrated the benefit in menstrually related migraine.  Magnesium is part of the messenger system in the serotonin cascade and it is a good muscle relaxant.  It is also useful for constipation which can be a side effect of other medications used to treat migraine. Good sources include nuts, whole grains, and tomatoes. Side Effects: loose stool/diarrhea  Riboflavin (vitamin B 2) 200 mg twice a day. This vitamin assists nerve cells in the production of ATP a principal energy storing molecule.  It is necessary for many chemical reactions in the body.  There have been at least 3 clinical trials of riboflavin using 400 mg per day all of which suggested that migraine frequency can be decreased.  All 3 trials showed significant improvement in over half of migraine sufferers.  The supplement is found in bread,  cereal, milk, meat, and poultry.  Most Americans get more riboflavin than the recommended daily allowance, however riboflavin deficiency is not necessary for the supplements to help prevent headache. Side effects: energizing, green urine   Coenzyme Q10: This is present in almost all cells in the body and is critical component for the conversion of energy.   Recent studies have shown that a nutritional supplement of CoQ10 can reduce the frequency of migraine attacks by improving the energy production of cells as with riboflavin.  Doses of 150 mg twice a day have been shown to be effective.   Melatonin: Increasing evidence shows correlation between melatonin secretion and headache conditions.  Melatonin supplementation has decreased headache intensity and duration.  It is widely used as a sleep aid.  Sleep is natures way of dealing with migraine.  A dose of 3 mg is recommended to start for headaches including cluster headache. Higher doses up to 15 mg has been reviewed for use in Cluster headache and have been used. The rationale behind using melatonin for cluster is that many theories regarding the cause of Cluster headache center around the disruption of the normal circadian rhythm in the brain.  This helps restore the normal circadian rhythm.   HEADACHE DIET: Foods and beverages which may trigger migraine Note that only 20% of headache patients are food sensitive. You will know if you are food sensitive if you get a headache consistently 20 minutes to 2 hours after eating a certain food. Only cut out a food if it causes headaches, otherwise you might remove foods you enjoy! What matters most for diet is to eat a well balanced healthy diet full of vegetables and low fat protein, and to not miss meals.   Chocolate, other sweets ALL cheeses except cottage and cream cheese Dairy products, yogurt, sour cream, ice cream Liver Meat extracts (Bovril, Marmite, meat tenderizers) Meats or fish which have undergone aging, fermenting, pickling or smoking. These include: Hotdogs,salami,Lox,sausage, mortadellas,smoked salmon, pepperoni, Pickled herring Pods of broad bean (English beans, Chinese pea pods, Italian (fava) beans, lima and navy beans Ripe avocado, ripe banana Yeast extracts or active yeast preparations such as Brewer's or Fleishman's (commercial bakes  goods are permitted) Tomato based foods, pizza (lasagna, etc.)   MSG (monosodium glutamate) is disguised as many things; look for these common aliases: Monopotassium glutamate Autolysed yeast Hydrolysed protein Sodium caseinate "flavorings" "all natural preservatives Nutrasweet   Avoid all other foods that convincingly provoke headaches.   Resources: The Dizzy Bluford Aid Your Headache Diet, migrainestrong.com  https://zamora-andrews.com/   Caffeine and Migraine For patients that have migraine, caffeine intake more than 3 days per week can lead to dependency and increased migraine frequency. I would recommend cutting back on your caffeine intake as best you can. The recommended amount of caffeine is 200-300 mg daily, although migraine patients may experience dependency at even lower doses. While you may notice an increase in headache temporarily, cutting back will be helpful for headaches in the long run. For more information on caffeine and migraine, visit: https://americanmigrainefoundation.org/resource-library/caffeine-and-migraine/   Headache Prevention Strategies:   1. Maintain a headache diary; learn to identify and avoid triggers.  - This can be a simple note where you log when you had a headache, associated symptoms, and medications used - There are several smartphone apps developed to help track migraines: Migraine Buddy, Migraine Monitor, Curelator N1-Headache App   Common triggers include: Emotional triggers: Emotional/Upset family or friends Emotional/Upset occupation Business reversal/success Anticipation anxiety Crisis-serious Post-crisis periodNew job/position  Physical triggers: Vacation Day Weekend Strenuous Exercise High Altitude Location New Move Menstrual Day Physical Illness Oversleep/Not enough sleep Weather changes Light: Photophobia or light sesnitivity treatment involves a balance between  desensitization and reduction in overly strong input. Use dark polarized glasses outside, but not inside. Avoid bright or fluorescent light, but do not dim environment to the point that going into a normally lit room hurts. Consider FL-41 tint lenses, which reduce the most irritating wavelengths without blocking too much light.  These can be obtained at axonoptics.com or theraspecs.com Foods: see list above.   2. Limit use of acute treatments (over-the-counter medications, triptans, etc.) to no more than 2 days per week or 10 days per month to prevent medication overuse headache (rebound headache).     3. Follow a regular schedule (including weekends and holidays): Don't skip meals. Eat a balanced diet. 8 hours of sleep nightly. Minimize stress. Exercise 30 minutes per day. Being overweight is associated with a 5 times increased risk of chronic migraine. Keep well hydrated and drink 6-8 glasses of water per day.   4. Initiate non-pharmacologic measures at the earliest onset of your headache. Rest and quiet environment. Relax and reduce stress. Breathe2Relax is a free app that can instruct you on    some simple relaxtion and breathing techniques. Http://Dawnbuse.com is a    free website that provides teaching videos on relaxation.  Also, there are  many apps that   can be downloaded for "mindful" relaxation.  An app called YOGA NIDRA will help walk you through mindfulness. Another app called Calm can be downloaded to give you a structured mindfulness guide with daily reminders and skill development. Headspace for guided meditation Mindfulness Based Stress Reduction Online Course: www.palousemindfulness.com Cold compresses.   5. Don't wait!! Take the maximum allowable dosage of prescribed medication at the first sign of migraine.   6. Compliance:  Take prescribed medication regularly as directed and at the first sign of a migraine.   7. Communicate:  Call your physician when problems arise,  especially if your headaches change, increase in frequency/severity, or become associated with neurological symptoms (weakness, numbness, slurred speech, etc.). Proceed to emergency room if you experience new or worsening symptoms or symptoms do not resolve, if you have new neurologic symptoms or if headache is severe, or for any concerning symptom.   8. Headache/pain management therapies: Consider various complementary methods, including medication, behavioral therapy, psychological counselling, biofeedback, massage therapy, acupuncture, dry needling, and other modalities.  Such measures may reduce the need for medications. Counseling for pain management, where patients learn to function and ignore/minimize their pain, seems to work very well.   9. Recommend changing family's attention and focus away from patient's headaches. Instead, emphasize daily activities. If first question of day is 'How are your headaches/Do you have a headache today?', then patient will constantly think about headaches, thus making them worse. Goal is to re-direct attention away from headaches, toward daily activities and other distractions.   10. Helpful Websites: www.AmericanHeadacheSociety.org patenthood.ch www.headaches.org tightmarket.nl www.achenet.org

## 2023-12-22 ENCOUNTER — Telehealth (INDEPENDENT_AMBULATORY_CARE_PROVIDER_SITE_OTHER): Payer: Managed Care, Other (non HMO) | Admitting: Family Medicine

## 2023-12-22 ENCOUNTER — Encounter: Payer: Self-pay | Admitting: Family Medicine

## 2023-12-22 DIAGNOSIS — G43101 Migraine with aura, not intractable, with status migrainosus: Secondary | ICD-10-CM

## 2023-12-22 MED ORDER — TOPIRAMATE 50 MG PO TABS: 50.0000 mg | ORAL_TABLET | Freq: Every day | ORAL | 3 refills | Status: AC

## 2023-12-22 MED ORDER — NURTEC 75 MG PO TBDP: 75.0000 mg | ORAL_TABLET | ORAL | 1 refills | Status: DC | PRN

## 2023-12-22 MED ORDER — ERENUMAB-AOOE 140 MG/ML ~~LOC~~ SOAJ: 140.0000 mg | SUBCUTANEOUS | 3 refills | Status: AC

## 2024-03-14 ENCOUNTER — Telehealth: Payer: Self-pay

## 2024-03-14 ENCOUNTER — Other Ambulatory Visit (HOSPITAL_COMMUNITY): Payer: Self-pay

## 2024-03-14 NOTE — Telephone Encounter (Signed)
 Pharmacy Patient Advocate Encounter   Received notification from CoverMyMeds that prior authorization for Aimovig 140MG /ML auto-injectors is required/requested.   Insurance verification completed.   The patient is insured through [facility] .   Per test claim: PA required; PA submitted to above mentioned insurance via CoverMyMeds Key/confirmation #/EOC [id number] Status is pending

## 2024-03-14 NOTE — Telephone Encounter (Signed)
 Pharmacy Patient Advocate Encounter  Received notification from [facility] that Prior Authorization for Aimovig 140MG /ML auto-injectors has been APPROVED from 03/14/2024 to 03/14/2025. Ran test claim, Copay is $50.00. This test claim was processed through [facility]- copay amounts may vary at other pharmacies due to pharmacy/plan contracts, or as the patient moves through the different stages of their insurance plan.   PA #/Case ID/Reference #: PA Case ID #: [id number]

## 2024-06-22 ENCOUNTER — Other Ambulatory Visit: Payer: Self-pay | Admitting: *Deleted

## 2024-06-22 MED ORDER — NURTEC 75 MG PO TBDP: 75.0000 mg | ORAL_TABLET | ORAL | 1 refills | Status: DC | PRN

## 2024-06-22 NOTE — Telephone Encounter (Signed)
 Last seen on 12/22/23 No 1 year follow up not scheduled

## 2024-08-30 ENCOUNTER — Telehealth: Payer: Self-pay | Admitting: Pharmacist

## 2024-08-30 NOTE — Telephone Encounter (Signed)
 Pharmacy Patient Advocate Encounter   Received notification from CoverMyMeds that prior authorization for Nurtec 75MG  dispersible tablets is required/requested.   Insurance verification completed.   The patient is insured through [facility] .   Per test claim: PA required; PA submitted to above mentioned insurance via Latent Key/confirmation #/EOC [facility] Status is pending

## 2024-08-31 NOTE — Telephone Encounter (Signed)
 Amy- how do you want to proceed? Last OV note mentioned Nurtec was not working well. See denial reason below

## 2024-08-31 NOTE — Telephone Encounter (Signed)
 Pharmacy Patient Advocate Encounter  Received notification from [employer] that Prior Authorization for NURTEC 75MG  has been DENIED.  See denial reason below. No denial letter attached in CMM. Will attach denial letter to Media tab once received.   PA #/Case ID/Reference #: [id number]  Per your health plan's criteria, this drug is covered if you meet the following: (1) Your doctor tells us  that this treatment is working for your condition (for example: reduction in pain, fear of light, fear of sounds, nausea). The information provided does not show that you meet the criteria listed above.

## 2024-09-01 ENCOUNTER — Telehealth: Payer: Self-pay | Admitting: Pharmacist

## 2024-09-01 NOTE — Telephone Encounter (Signed)
 The appeal for Nurtec has been approved by the insurance through 09/01/2026.  Thank you, Devere Pandy, PharmD Clinical Pharmacist  [place]  Direct Dial: [phone]

## 2024-09-01 NOTE — Telephone Encounter (Signed)
 E-Appeal has been submitted. Will advise when response is received, please be advised that most companies may take 30 days to make a decision. Appeal letter and supporting documentation have been uploaded and submitted via the [facility] website.  Thank you, Devere Pandy, PharmD Clinical Pharmacist  [place]  Direct Dial: [phone]

## 2024-09-01 NOTE — Telephone Encounter (Signed)
 Sent mychart to pt notifying them about approval.

## 2024-09-22 ENCOUNTER — Other Ambulatory Visit: Payer: Self-pay | Admitting: *Deleted

## 2024-09-22 MED ORDER — NURTEC 75 MG PO TBDP: 75.0000 mg | ORAL_TABLET | ORAL | 1 refills | Status: AC | PRN

## 2024-09-22 NOTE — Telephone Encounter (Signed)
 Last seen on 12/22/23 No 1 year follow up scheduled
